# Patient Record
Sex: Male | Born: 1955 | Race: White | Hispanic: No | Marital: Married | Smoking: Never smoker
Health system: Southern US, Community
[De-identification: ages and names within clinical notes are randomized; demographics above are authoritative.]

## PROBLEM LIST (undated history)

## (undated) DIAGNOSIS — Z789 Other specified health status: Secondary | ICD-10-CM

## (undated) DIAGNOSIS — T7840XA Allergy, unspecified, initial encounter: Secondary | ICD-10-CM

## (undated) DIAGNOSIS — K429 Umbilical hernia without obstruction or gangrene: Secondary | ICD-10-CM

## (undated) DIAGNOSIS — E785 Hyperlipidemia, unspecified: Secondary | ICD-10-CM

## (undated) HISTORY — PX: EYE SURGERY: SHX253

## (undated) HISTORY — PX: HERNIA REPAIR: SHX51

## (undated) HISTORY — DX: Hyperlipidemia, unspecified: E78.5

## (undated) HISTORY — DX: Allergy, unspecified, initial encounter: T78.40XA

## (undated) HISTORY — PX: NO PAST SURGERIES: SHX2092

## (undated) HISTORY — PX: COLONOSCOPY: SHX174

---

## 2007-12-02 ENCOUNTER — Encounter: Admission: RE | Admit: 2007-12-02 | Discharge: 2007-12-02 | Payer: Self-pay | Admitting: Emergency Medicine

## 2007-12-24 ENCOUNTER — Ambulatory Visit: Payer: Self-pay | Admitting: Internal Medicine

## 2008-01-27 ENCOUNTER — Ambulatory Visit: Payer: Self-pay | Admitting: Internal Medicine

## 2008-01-27 ENCOUNTER — Encounter: Payer: Self-pay | Admitting: Internal Medicine

## 2011-10-16 ENCOUNTER — Ambulatory Visit (INDEPENDENT_AMBULATORY_CARE_PROVIDER_SITE_OTHER): Payer: Federal, State, Local not specified - PPO | Admitting: Emergency Medicine

## 2011-10-16 DIAGNOSIS — E789 Disorder of lipoprotein metabolism, unspecified: Secondary | ICD-10-CM

## 2011-10-16 DIAGNOSIS — R079 Chest pain, unspecified: Secondary | ICD-10-CM

## 2012-01-04 ENCOUNTER — Other Ambulatory Visit: Payer: Self-pay | Admitting: Family Medicine

## 2012-01-04 MED ORDER — ROSUVASTATIN CALCIUM 10 MG PO TABS: 10.0000 mg | ORAL_TABLET | Freq: Every day | ORAL | Status: DC

## 2012-01-30 ENCOUNTER — Other Ambulatory Visit: Payer: Self-pay | Admitting: Dermatology

## 2012-04-28 ENCOUNTER — Other Ambulatory Visit: Payer: Self-pay | Admitting: Physician Assistant

## 2012-07-27 ENCOUNTER — Other Ambulatory Visit: Payer: Self-pay | Admitting: Physician Assistant

## 2012-08-05 ENCOUNTER — Ambulatory Visit (INDEPENDENT_AMBULATORY_CARE_PROVIDER_SITE_OTHER): Payer: Federal, State, Local not specified - PPO | Admitting: Emergency Medicine

## 2012-08-05 ENCOUNTER — Encounter: Payer: Self-pay | Admitting: Emergency Medicine

## 2012-08-05 VITALS — BP 134/80 | HR 53 | Temp 97.8°F | Resp 16 | Ht 71.5 in | Wt 216.6 lb

## 2012-08-05 DIAGNOSIS — Z23 Encounter for immunization: Secondary | ICD-10-CM

## 2012-08-05 DIAGNOSIS — E785 Hyperlipidemia, unspecified: Secondary | ICD-10-CM

## 2012-08-05 DIAGNOSIS — Z79899 Other long term (current) drug therapy: Secondary | ICD-10-CM

## 2012-08-05 MED ORDER — ROSUVASTATIN CALCIUM 10 MG PO TABS: 10.0000 mg | ORAL_TABLET | Freq: Every day | ORAL | Status: DC

## 2012-08-05 NOTE — Progress Notes (Signed)
  Subjective:    Patient ID: Ralph Hayes, male    DOB: [birth year], 56 y.o.   MRN: [id number]  HPI patient currently on Crestor 10 mg a day for cholesterol treatment. He denies any myalgias. He continues to try and exercise and has been a little better watching his diet the    Review of Systems     Objective:   Physical Exam HEENT exam is negative chest clear heart regular rate no murmurs        Assessment & Plan:  Lipid panel was done today to check his fasting lipid panel and cemented recheck next visit for a physical.

## 2012-08-06 LAB — COMPREHENSIVE METABOLIC PANEL
ALT: 26 U/L (ref 0–53)
AST: 19 U/L (ref 0–37)
Albumin: 4.4 g/dL (ref 3.5–5.2)
BUN: 13 mg/dL (ref 6–23)
CO2: 29 mEq/L (ref 19–32)
Calcium: 9.1 mg/dL (ref 8.4–10.5)
Chloride: 107 mEq/L (ref 96–112)
Creat: 0.88 mg/dL (ref 0.50–1.35)
Potassium: 4.4 mEq/L (ref 3.5–5.3)

## 2012-08-06 LAB — LIPID PANEL
Cholesterol: 178 mg/dL (ref 0–200)
Triglycerides: 137 mg/dL (ref <150)

## 2013-02-04 ENCOUNTER — Telehealth: Payer: Self-pay | Admitting: Radiology

## 2013-02-04 NOTE — Telephone Encounter (Signed)
Pt would like to talk to Dr. Cleta Alberts (she is aware he is off til Friday) in regards to her husband, Ahan (his pt). Would not give me any further info Nancy [phone], I called Harriett Sine and she wants to speak to Dr Cleta Alberts, about patient. She states the room he sleeps in smells bad, she wants to know if there is something to do about this. He does not complain of feeling bad. He does have a hernia that needs repair, but other than this he is well. Speak to me about this please.

## 2013-02-05 NOTE — Telephone Encounter (Signed)
I called the patient's wife Ralph Hayes and asked for more information as to where she thought the odor was coming from. I asked her to call back and give [place] more information. I told her I would be happy to see Ralph Hayes and reevaluate him to see if we can figure out the source of the odor.

## 2013-02-05 NOTE — Telephone Encounter (Signed)
Wife called back and explains that she smells the bad odor in the mornings in the room and in sheets. States it is not a matter of hygiene, pt takes good care of himself w/bathing, hair, etc., and teeth are in good shape. Pt does not complain of anything except occasionally of a pain in the upper left abdomen/ribs area. Wife reports that she doesn't just notice the odor on his skin or when she gets close to him, it seems to be more of an odor in the room from his breathing all night, maybe. She stated that he would be very embarrassed to know she has called with this concern, and will discuss with him if needed. I advised her that pt is due for f/up visit anyway and she stated that if he got a call that he needs to schedule an appt he will be happy to come in. She didn't know if we could just examine him for this during normal f/up visit. Wife wondered if odor could be from the Crestor he takes or a supplement. Dr Cleta Alberts, please advise.

## 2013-02-06 NOTE — Telephone Encounter (Signed)
Raynelle Fanning, will you call patient and schedule an appt for him, his wife does not want him to know she called [place] about him, just let him know Dr Cleta Alberts needs to see him in follow up. See me if questions, send message back to me when done, so I can call his wife. Amy

## 2013-02-06 NOTE — Telephone Encounter (Signed)
It certainly could be a vitamin or supplement especially if he is taking any fish oil.Marland Kitchen He certainly can get an odor to his breath. I will be happy to address this at his followup visit

## 2013-02-10 NOTE — Telephone Encounter (Signed)
Left msg for pt to call to schedule 6 month f-up with Dr. Cleta Alberts.

## 2013-02-13 ENCOUNTER — Ambulatory Visit (INDEPENDENT_AMBULATORY_CARE_PROVIDER_SITE_OTHER): Payer: Federal, State, Local not specified - PPO | Admitting: Emergency Medicine

## 2013-02-13 ENCOUNTER — Telehealth: Payer: Self-pay | Admitting: Emergency Medicine

## 2013-02-13 VITALS — BP 120/77 | HR 64 | Temp 97.9°F | Resp 18 | Wt 218.0 lb

## 2013-02-13 DIAGNOSIS — E785 Hyperlipidemia, unspecified: Secondary | ICD-10-CM

## 2013-02-13 DIAGNOSIS — Z79899 Other long term (current) drug therapy: Secondary | ICD-10-CM

## 2013-02-13 DIAGNOSIS — N529 Male erectile dysfunction, unspecified: Secondary | ICD-10-CM

## 2013-02-13 LAB — LIPID PANEL
HDL: 47 mg/dL (ref >39)
LDL Cholesterol: 116 mg/dL — ABNORMAL HIGH (ref 0–99)
Triglycerides: 111 mg/dL (ref <150)

## 2013-02-13 LAB — AST: AST: 19 U/L (ref 0–37)

## 2013-02-13 MED ORDER — ROSUVASTATIN CALCIUM 10 MG PO TABS: 10.0000 mg | ORAL_TABLET | Freq: Every day | ORAL | Status: DC

## 2013-02-13 MED ORDER — TADALAFIL 20 MG PO TABS: ORAL_TABLET | ORAL | Status: DC

## 2013-02-13 NOTE — Progress Notes (Signed)
  Subjective:    Patient ID: Ralph Hayes, male    DOB: 09-22-1956, 57 y.o.   MRN: [id number]  HPI patient in for followup of his hyperlipidemia. He is currently on Crestor. There is some question about whether there is an odor to his skin his wife was concerned about that. He also is interested in trying to medication for ED. He has decreased libido and he and his wife have talked about this since her hysterectomy.    Review of Systems     Objective:   Physical Exam patient is alert and cooperative and not in any distress. His neck is supple. His chest is clear to both auscultation and percussion. His heart is regular rate without murmurs rubs or gallops abdomen reveals a small umbilical hernia but no other masses or tenderness        Assessment & Plan:  We'll go ahead and check routine labs. I did send her a prescription for Cialis for him to try. I refilled his Crestor.

## 2013-02-13 NOTE — Telephone Encounter (Signed)
Please call patient with him know his cholesterol is good. his LDL is still slightly high. liver tests are normal no change in medication

## 2013-02-16 NOTE — Telephone Encounter (Signed)
No response to my calls either thanks.

## 2013-02-16 NOTE — Telephone Encounter (Signed)
Patient advised.

## 2013-02-16 NOTE — Telephone Encounter (Signed)
No response from pt in regards to scheduling an appt.

## 2013-04-26 ENCOUNTER — Telehealth: Payer: Self-pay | Admitting: *Deleted

## 2013-04-26 NOTE — Telephone Encounter (Signed)
Pt wife came into office today and was goin to check in to talk about her husband because her husband was not able to come in to be seen because he was not able to get out of bed and come down stairs because of back pain.  I advised her that we cannot see her and talk to her about her husband. She stated that she called the office twice.  I only saw the one message which was put under his wife name Ralph Hayes and the other message was not put in.  I advised her that we would have to see him before we would be able to do anything for him and that if she cannot get him out of bed that she should call an ambulance to get him transported to the ER to be evaluated.  She stated that she does not understand why we could not do this because he has been a long time pt.  I also advised that we have to document things and we have to see the pt and cannot treat over the phone without seeing this recently for this issue.

## 2013-04-27 ENCOUNTER — Ambulatory Visit: Payer: Federal, State, Local not specified - PPO

## 2013-04-27 ENCOUNTER — Ambulatory Visit (INDEPENDENT_AMBULATORY_CARE_PROVIDER_SITE_OTHER): Payer: Federal, State, Local not specified - PPO | Admitting: Emergency Medicine

## 2013-04-27 VITALS — BP 124/70 | HR 68 | Temp 98.0°F | Resp 16 | Ht 71.5 in | Wt 221.0 lb

## 2013-04-27 DIAGNOSIS — M62830 Muscle spasm of back: Secondary | ICD-10-CM

## 2013-04-27 DIAGNOSIS — M5380 Other specified dorsopathies, site unspecified: Secondary | ICD-10-CM

## 2013-04-27 DIAGNOSIS — Z77090 Contact with and (suspected) exposure to asbestos: Secondary | ICD-10-CM

## 2013-04-27 DIAGNOSIS — M545 Low back pain, unspecified: Secondary | ICD-10-CM

## 2013-04-27 MED ORDER — METHOCARBAMOL 750 MG PO TABS: 750.0000 mg | ORAL_TABLET | Freq: Four times a day (QID) | ORAL | Status: DC

## 2013-04-27 MED ORDER — HYDROCODONE-ACETAMINOPHEN 5-325 MG PO TABS: 1.0000 | ORAL_TABLET | Freq: Four times a day (QID) | ORAL | Status: DC | PRN

## 2013-04-27 MED ORDER — NABUMETONE 750 MG PO TABS
750.0000 mg | ORAL_TABLET | Freq: Two times a day (BID) | ORAL | Status: DC
Start: 2013-04-27 — End: 2015-04-07
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
## 2013-09-03 ENCOUNTER — Other Ambulatory Visit: Payer: Self-pay

## 2014-01-06 ENCOUNTER — Ambulatory Visit: Payer: Federal, State, Local not specified - PPO

## 2014-01-06 ENCOUNTER — Ambulatory Visit (INDEPENDENT_AMBULATORY_CARE_PROVIDER_SITE_OTHER): Payer: Federal, State, Local not specified - PPO | Admitting: Emergency Medicine

## 2014-01-06 VITALS — BP 110/80 | HR 48 | Temp 98.0°F | Resp 16 | Ht 72.0 in | Wt 225.0 lb

## 2014-01-06 DIAGNOSIS — R079 Chest pain, unspecified: Secondary | ICD-10-CM

## 2014-01-06 DIAGNOSIS — R0781 Pleurodynia: Secondary | ICD-10-CM

## 2014-01-06 DIAGNOSIS — M940 Chondrocostal junction syndrome [Tietze]: Secondary | ICD-10-CM

## 2014-01-06 LAB — POCT UA - MICROSCOPIC ONLY
BACTERIA, U MICROSCOPIC: NEGATIVE
Casts, Ur, LPF, POC: NEGATIVE
Crystals, Ur, HPF, POC: NEGATIVE
EPITHELIAL CELLS, URINE PER MICROSCOPY: NEGATIVE
MUCUS UA: NEGATIVE
RBC, URINE, MICROSCOPIC: NEGATIVE
Yeast, UA: NEGATIVE

## 2014-01-06 LAB — POCT URINALYSIS DIPSTICK
BILIRUBIN UA: NEGATIVE
Glucose, UA: NEGATIVE
KETONES UA: NEGATIVE
Leukocytes, UA: NEGATIVE
Nitrite, UA: NEGATIVE
PH UA: 5.5
Protein, UA: NEGATIVE
RBC UA: NEGATIVE
Urobilinogen, UA: 1

## 2014-01-06 LAB — POCT CBC
GRANULOCYTE PERCENT: 60.6 % (ref 37–80)
HEMATOCRIT: 46.2 % (ref 43.5–53.7)
HEMOGLOBIN: 14.7 g/dL (ref 14.1–18.1)
LYMPH, POC: 2 (ref 0.6–3.4)
MCH, POC: 29 pg (ref 27–31.2)
MCHC: 31.8 g/dL (ref 31.8–35.4)
MCV: 91.2 fL (ref 80–97)
MID (cbc): 0.3 (ref 0–0.9)
MPV: 8.1 fL (ref 0–99.8)
POC GRANULOCYTE: 3.6 (ref 2–6.9)
POC LYMPH PERCENT: 33.9 %L (ref 10–50)
POC MID %: 5.5 %M (ref 0–12)
Platelet Count, POC: 265 10*3/uL (ref 142–424)
RBC: 5.07 M/uL (ref 4.69–6.13)
RDW, POC: 14.2 %
WBC: 6 10*3/uL (ref 4.6–10.2)

## 2014-01-06 MED ORDER — MELOXICAM 15 MG PO TABS: 15.0000 mg | ORAL_TABLET | Freq: Every day | ORAL | Status: DC

## 2014-01-06 NOTE — Progress Notes (Signed)
Subjective:    Patient ID: Ralph Hayes, male    DOB: 11/11/1955, 58 y.o.   MRN: [id number] This chart was scribed for [name], MD by Terressa Koyanagi, ED Scribe. This patient was seen in room 1 and the patient's care was started at 8:24 AM.  HPI HPI Comments: Ralph Hayes is a 58 y.o. male who presents to the Emergency Department complaining of intermittent left sided abd pain onset a few days ago. Pt denies any associated pain around his ribs. Pt states that the pain is aggravated when he bends forward or twists his abdomen. Pt denies any injury to the area. Pt further denies SOB, nausea, vomiting, change in bowel movements. Pt further reports that he is UTD on his colonoscopy.   Review of Systems  Constitutional: Negative for fever.  Respiratory: Negative for shortness of breath.   Gastrointestinal: Positive for abdominal pain. Negative for nausea and vomiting.  Neurological: Negative.   Psychiatric/Behavioral: Negative.    Objective:   Physical Exam CONSTITUTIONAL: Well developed/well nourished HEAD: Normocephalic/atraumatic EYES: EOMI/PERRL ENMT: Mucous membranes moist NECK: supple no meningeal signs SPINE:entire spine nontender CV: S1/S2 noted, no murmurs/rubs/gallops noted LUNGS: Lungs are clear to auscultation bilaterally, no apparent distress ABDOMEN: soft, nontender, no rebound or guarding GU:no cva tenderness MUSCULOSKELETAL: Tenderness over the lower margin of the left lower ribs NEURO: Pt is awake/alert, moves all extremitiesx4 EXTREMITIES: pulses normal, full ROM SKIN: warm, color normal PSYCH: no abnormalities of mood noted [facility] reading (PRIMARY) by  Dr Everlene Farrier no acute disease . Please comment on heart size. Results for orders placed in visit on 01/06/14  POCT CBC      Result Value Ref Range   WBC 6.0  4.6 - 10.2 K/uL   Lymph, poc 2.0  0.6 - 3.4   POC LYMPH PERCENT 33.9  10 - 50 %L   MID (cbc) 0.3  0 - 0.9   POC MID % 5.5  0 - 12 %M   POC Granulocyte 3.6   2 - 6.9   Granulocyte percent 60.6  37 - 80 %G   RBC 5.07  4.69 - 6.13 M/uL   Hemoglobin 14.7  14.1 - 18.1 g/dL   HCT, POC 46.2  43.5 - 53.7 %   MCV 91.2  80 - 97 fL   MCH, POC 29.0  27 - 31.2 pg   MCHC 31.8  31.8 - 35.4 g/dL   RDW, POC 14.2     Platelet Count, POC 265  142 - 424 K/uL   MPV 8.1  0 - 99.8 fL  POCT UA - MICROSCOPIC ONLY      Result Value Ref Range   WBC, Ur, HPF, POC 0-1     RBC, urine, microscopic neg     Bacteria, U Microscopic neg     Mucus, UA neg     Epithelial cells, urine per micros neg     Crystals, Ur, HPF, POC neg     Casts, Ur, LPF, POC neg     Yeast, UA neg    POCT URINALYSIS DIPSTICK      Result Value Ref Range   Color, UA amber     Clarity, UA clear     Glucose, UA neg     Bilirubin, UA neg     Ketones, UA neg     Spec Grav, UA >=1.030     Blood, UA neg     pH, UA 5.5     Protein, UA neg  Urobilinogen, UA 1.0     Nitrite, UA neg     Leukocytes, UA Negative         Assessment & Plan:  I suspect this is musculoskeletal pain. We'll treat with Mobic 15 mg one a day for 10 days. If no improvement after that time we'll proceed with a CT of that area

## 2014-01-06 NOTE — Patient Instructions (Signed)

## 2014-01-15 ENCOUNTER — Telehealth: Payer: Self-pay

## 2014-01-15 NOTE — Telephone Encounter (Signed)
Left message on machine for patient to call back with symptoms, etc. Will schedule CT as soon as hear from patient.  Also, need to ask Dr Everlene Farrier what kind of CT study we should order.  Awaiting call back from patient.

## 2014-01-15 NOTE — Telephone Encounter (Signed)
Patient called requesting to speak with Dr. Everlene Farrier regarding his previous visit.  Patient stated that Dr. Everlene Farrier asked him to call in if he was not feeling better and to arrange a CT scan. Please have Dr. Everlene Farrier or staff member call this patient ASAP.  Thank You!!!

## 2014-01-18 ENCOUNTER — Telehealth: Payer: Self-pay

## 2014-01-18 ENCOUNTER — Other Ambulatory Visit: Payer: Self-pay | Admitting: Emergency Medicine

## 2014-01-18 DIAGNOSIS — R1032 Left lower quadrant pain: Secondary | ICD-10-CM

## 2014-01-18 NOTE — Telephone Encounter (Signed)
Duplicate message- Waiting for response from provider to order CT scan.

## 2014-01-18 NOTE — Telephone Encounter (Signed)
Patient is calling asking about a ct scan please call him at [phone]

## 2014-01-18 NOTE — Telephone Encounter (Signed)
I left a message on his answering machine. I placed an order for CT abdomen and pelvis to evaluate persistent left upper abdominal pain

## 2014-01-18 NOTE — Telephone Encounter (Signed)
Called- LM per last OV if pain continues to proceed with CT scan.  Dr. Everlene Farrier please advise what CT you want ordered. CT with and without?

## 2014-01-25 ENCOUNTER — Ambulatory Visit
Admission: RE | Admit: 2014-01-25 | Discharge: 2014-01-25 | Disposition: A | Discharge status: Home / Self Care | Payer: Federal, State, Local not specified - PPO | Source: Ambulatory Visit | Attending: Emergency Medicine | Admitting: Emergency Medicine

## 2014-01-25 DIAGNOSIS — R1032 Left lower quadrant pain: Secondary | ICD-10-CM

## 2014-01-25 MED ORDER — IOHEXOL 300 MG/ML  SOLN
125.0000 mL | Freq: Once | INTRAMUSCULAR | Status: AC | PRN
  Administered 2014-01-25: 125 mL via INTRAVENOUS

## 2014-01-25 MED ORDER — IOHEXOL 300 MG/ML  SOLN: 100.0000 mL | Freq: Once | INTRAMUSCULAR | Status: DC | PRN

## 2014-04-14 ENCOUNTER — Ambulatory Visit (INDEPENDENT_AMBULATORY_CARE_PROVIDER_SITE_OTHER): Payer: Federal, State, Local not specified - PPO

## 2014-04-14 ENCOUNTER — Ambulatory Visit (INDEPENDENT_AMBULATORY_CARE_PROVIDER_SITE_OTHER): Payer: Federal, State, Local not specified - PPO | Admitting: Family Medicine

## 2014-04-14 VITALS — BP 130/84 | HR 84 | Temp 98.1°F | Resp 16 | Ht 71.0 in | Wt 223.2 lb

## 2014-04-14 DIAGNOSIS — M25569 Pain in unspecified knee: Secondary | ICD-10-CM

## 2014-04-14 DIAGNOSIS — M25561 Pain in right knee: Secondary | ICD-10-CM

## 2014-04-14 NOTE — Patient Instructions (Signed)
Over the counter ibuprofen if needed, ice to affected area if needed and avoid direct pressure as much as possible for next few weeks. If pain persists, let me know and I can refer you to ortho for another opinion.
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
## 2014-05-04 ENCOUNTER — Encounter: Payer: Self-pay | Admitting: Emergency Medicine

## 2014-05-04 ENCOUNTER — Ambulatory Visit (INDEPENDENT_AMBULATORY_CARE_PROVIDER_SITE_OTHER): Payer: Federal, State, Local not specified - PPO | Admitting: Emergency Medicine

## 2014-05-04 VITALS — BP 110/70 | HR 49 | Temp 97.7°F | Resp 16 | Ht 72.0 in | Wt 223.8 lb

## 2014-05-04 DIAGNOSIS — Z0000 Encounter for general adult medical examination without abnormal findings: Secondary | ICD-10-CM

## 2014-05-04 DIAGNOSIS — I498 Other specified cardiac arrhythmias: Secondary | ICD-10-CM

## 2014-05-04 DIAGNOSIS — R001 Bradycardia, unspecified: Secondary | ICD-10-CM | POA: Insufficient documentation

## 2014-05-04 LAB — COMPLETE METABOLIC PANEL WITH GFR
ALBUMIN: 4.3 g/dL (ref 3.5–5.2)
ALK PHOS: 77 U/L (ref 39–117)
ALT: 23 U/L (ref 0–53)
AST: 15 U/L (ref 0–37)
BUN: 20 mg/dL (ref 6–23)
CO2: 27 meq/L (ref 19–32)
Calcium: 8.7 mg/dL (ref 8.4–10.5)
Chloride: 107 mEq/L (ref 96–112)
Creat: 0.97 mg/dL (ref 0.50–1.35)
GFR, EST NON AFRICAN AMERICAN: 86 mL/min
GLUCOSE: 99 mg/dL (ref 70–99)
POTASSIUM: 4 meq/L (ref 3.5–5.3)
Sodium: 142 mEq/L (ref 135–145)
Total Bilirubin: 1.3 mg/dL — ABNORMAL HIGH (ref 0.2–1.2)
Total Protein: 6.5 g/dL (ref 6.0–8.3)

## 2014-05-04 LAB — CBC WITH DIFFERENTIAL/PLATELET
BASOS PCT: 1 % (ref 0–1)
Basophils Absolute: 0.1 10*3/uL (ref 0.0–0.1)
EOS ABS: 0.2 10*3/uL (ref 0.0–0.7)
Eosinophils Relative: 4 % (ref 0–5)
HEMATOCRIT: 46.4 % (ref 39.0–52.0)
HEMOGLOBIN: 15.7 g/dL (ref 13.0–17.0)
LYMPHS ABS: 1.6 10*3/uL (ref 0.7–4.0)
Lymphocytes Relative: 30 % (ref 12–46)
MCH: 28.4 pg (ref 26.0–34.0)
MCHC: 33.8 g/dL (ref 30.0–36.0)
MCV: 84.1 fL (ref 78.0–100.0)
MONO ABS: 0.5 10*3/uL (ref 0.1–1.0)
MONOS PCT: 9 % (ref 3–12)
Neutro Abs: 3 10*3/uL (ref 1.7–7.7)
Neutrophils Relative %: 56 % (ref 43–77)
Platelets: 231 10*3/uL (ref 150–400)
RBC: 5.52 MIL/uL (ref 4.22–5.81)
RDW: 13.4 % (ref 11.5–15.5)
WBC: 5.4 10*3/uL (ref 4.0–10.5)

## 2014-05-04 LAB — POCT URINALYSIS DIPSTICK
BILIRUBIN UA: NEGATIVE
Blood, UA: NEGATIVE
GLUCOSE UA: NEGATIVE
KETONES UA: NEGATIVE
LEUKOCYTES UA: NEGATIVE
NITRITE UA: NEGATIVE
PH UA: 6
Protein, UA: NEGATIVE
Spec Grav, UA: 1.02
Urobilinogen, UA: 2

## 2014-05-04 LAB — LIPID PANEL
Cholesterol: 276 mg/dL — ABNORMAL HIGH (ref 0–200)
HDL: 54 mg/dL (ref >39)
LDL CALC: 183 mg/dL — AB (ref 0–99)
Total CHOL/HDL Ratio: 5.1 Ratio
Triglycerides: 196 mg/dL — ABNORMAL HIGH (ref <150)
VLDL: 39 mg/dL (ref 0–40)

## 2014-05-04 LAB — IFOBT (OCCULT BLOOD): IMMUNOLOGICAL FECAL OCCULT BLOOD TEST: NEGATIVE

## 2014-05-04 MED ORDER — MUPIROCIN 2 % EX OINT: TOPICAL_OINTMENT | CUTANEOUS | Status: DC

## 2014-05-04 MED ORDER — ZOSTER VACCINE LIVE 19400 UNT/0.65ML ~~LOC~~ SOLR: 0.6500 mL | Freq: Once | SUBCUTANEOUS | Status: DC

## 2014-05-04 NOTE — Progress Notes (Signed)
   Subjective:    Patient ID: Ralph Hayes, male    DOB: Feb 14, 1956, 58 y.o.   MRN: [id number]  HPI    Review of Systems  Constitutional: Negative.   HENT: Negative.   Eyes: Negative.   Respiratory: Negative.   Cardiovascular: Negative.   Gastrointestinal: Negative.   Endocrine: Negative.   Genitourinary: Negative.   Musculoskeletal: Negative.   Skin: Negative.   Allergic/Immunologic: Negative.   Neurological: Negative.   Hematological: Negative.   Psychiatric/Behavioral: Negative.        Objective:   Physical Exam  Constitutional: He is oriented to person, place, and time. He appears well-developed and well-nourished.  HENT:  Head: Normocephalic.  Right Ear: External ear normal.  Left Ear: External ear normal.  Eyes: Pupils are equal, round, and reactive to light.  Neck: Normal range of motion. No tracheal deviation present. No thyromegaly present.  Cardiovascular: Normal heart sounds and intact distal pulses.   The patient has a slow regular rate  Pulmonary/Chest: Effort normal and breath sounds normal. No respiratory distress.  Abdominal: Soft. Bowel sounds are normal.  Genitourinary: Prostate normal.  Musculoskeletal:  There is mild tenderness along the lateral joint space of the right knee  Neurological: He is alert and oriented to person, place, and time. He has normal reflexes.  Skin: Skin is warm and dry.  Psychiatric: He has a normal mood and affect. His behavior is normal. Judgment and thought content normal.   Ekg normal sinus rhythm with sinus bradycardia T-wave down the Lead 3 Results for orders placed in visit on 05/04/14  POCT URINALYSIS DIPSTICK      Result Value Ref Range   Color, UA yellow     Clarity, UA clear     Glucose, UA neg     Bilirubin, UA neg     Ketones, UA neg     Spec Grav, UA 1.020     Blood, UA neg     pH, UA 6.0     Protein, UA neg     Urobilinogen, UA 2.0     Nitrite, UA neg     Leukocytes, UA Negative    IFOBT (OCCULT  BLOOD)      Result Value Ref Range   IFOBT Negative          Assessment & Plan:  Routine labs done. Prescription given for Zostavax. He is up-to-date on tetanus. Last colonoscopy was 2009. No change in medication. He developed an odor when he took crestor.

## 2014-05-04 NOTE — Addendum Note (Signed)
Addended byCandice Camp on: 05/04/2014 12:06 PM   Modules accepted: Orders

## 2014-05-05 LAB — PSA: PSA: 2.78 ng/mL (ref <=4.00)

## 2014-05-06 ENCOUNTER — Telehealth: Payer: Self-pay

## 2014-05-06 DIAGNOSIS — E785 Hyperlipidemia, unspecified: Secondary | ICD-10-CM

## 2014-05-06 MED ORDER — ATORVASTATIN CALCIUM 40 MG PO TABS: 40.0000 mg | ORAL_TABLET | Freq: Every day | ORAL | Status: DC

## 2014-05-06 NOTE — Telephone Encounter (Signed)
Spoke to pt, he is rescheduling his PSA appt, for 4 months along with the chol. Review.  He is aware of med sent to pharm.

## 2014-05-06 NOTE — Telephone Encounter (Signed)
PT STATES SOMEONE HAD CALLED HIM REGARDING HIS LABS RESULTS, HOWEVER PATIENT IS UNCLEAR ABOUT THE PLAN OF TREATMENT FOR CHOLESTEROL ISSUES. PLEASE CALL PT TO ADVISE IF RX IS BEING PRESCRIBED (HE DID SCHEDULE A 6 MONTH F/U IN January TO HAVE PSA LEVELS RECHECKED)

## 2014-08-16 DIAGNOSIS — H17813 Minor opacity of cornea, bilateral: Secondary | ICD-10-CM | POA: Insufficient documentation

## 2014-08-16 DIAGNOSIS — Z9889 Other specified postprocedural states: Secondary | ICD-10-CM | POA: Insufficient documentation

## 2014-09-14 ENCOUNTER — Telehealth: Payer: Self-pay

## 2014-09-14 NOTE — Telephone Encounter (Signed)
Pt is scheduled to see dr Everlene Farrier on September 21, 2014 for a cholesterol recheck and he has another appointment for November 09, 2014 for PSA recheck. Pt wants to know if this is correct to have two separate appointments and wanted to ask if the two issues can be addressed in one appointment instead of two.

## 2014-09-14 NOTE — Telephone Encounter (Signed)
FYI  Pt will follow up on November 24 for both issues and keep the January appt if follow up is needed. He will cancel that appt at Nov appt if no follow up is needed.

## 2014-09-21 ENCOUNTER — Encounter: Payer: Self-pay | Admitting: Emergency Medicine

## 2014-09-21 ENCOUNTER — Ambulatory Visit (INDEPENDENT_AMBULATORY_CARE_PROVIDER_SITE_OTHER): Payer: Federal, State, Local not specified - PPO | Admitting: Emergency Medicine

## 2014-09-21 VITALS — BP 116/72 | HR 51 | Temp 98.4°F | Resp 16 | Ht 72.0 in | Wt 218.0 lb

## 2014-09-21 DIAGNOSIS — R972 Elevated prostate specific antigen [PSA]: Secondary | ICD-10-CM

## 2014-09-21 DIAGNOSIS — E785 Hyperlipidemia, unspecified: Secondary | ICD-10-CM

## 2014-09-21 DIAGNOSIS — Z23 Encounter for immunization: Secondary | ICD-10-CM

## 2014-09-21 DIAGNOSIS — Z79899 Other long term (current) drug therapy: Secondary | ICD-10-CM

## 2014-09-21 LAB — LIPID PANEL
Cholesterol: 149 mg/dL (ref 0–200)
HDL: 47 mg/dL (ref >39)
LDL CALC: 79 mg/dL (ref 0–99)
TRIGLYCERIDES: 114 mg/dL (ref <150)
Total CHOL/HDL Ratio: 3.2 Ratio
VLDL: 23 mg/dL (ref 0–40)

## 2014-09-21 LAB — AST: AST: 24 U/L (ref 0–37)

## 2014-09-21 NOTE — Progress Notes (Signed)
Subjective:   This chart was scribed for [name], MD by Erling Conte, Medical Scribe. This patient was seen in Room 22  and the patient's care was started at 8:13 AM.   Patient ID: Ralph Hayes, male    DOB: 07-Oct-1956, 58 y.o.   MRN: [id number]  Chief Complaint  Patient presents with  . Hyperlipidemia    HPI HPI Comments: Ralph Hayes is a 58 y.o. male who presents to the [facility] who is here for a recheck on his cholesterol. Pt cholesterol was 276 back in July when he was last here. Pt states he has been doing better with Lipitor. He was previously on Crestor but reported some increased body odor with that medication. Pt would also like to have a flu shot and PSA done at this visit. Pt last PSA was 2.78. He denies any muscle aches.   Patient Active Problem List   Diagnosis Date Noted  . Bradycardia 05/04/2014   Past Medical History  Diagnosis Date  . Hyperlipidemia    No past surgical history on file. Allergies  Allergen Reactions  . Penicillins     Swell   Prior to Admission medications   Medication Sig Start Date End Date Taking? Authorizing Provider  aspirin 81 MG tablet Take 81 mg by mouth daily.    Historical Provider, MD  atorvastatin (LIPITOR) 40 MG tablet Take 1 tablet (40 mg total) by mouth daily. 05/06/14   [name], MD  B Complex-C (B-COMPLEX WITH VITAMIN C) tablet Take 1 tablet by mouth daily.    Historical Provider, MD  cholecalciferol (VITAMIN D) 1000 UNITS tablet Take 1,000 Units by mouth daily.    Historical Provider, MD  fish oil-omega-3 fatty acids 1000 MG capsule Take 2 g by mouth daily.    Historical Provider, MD  Glucosamine-Chondroit-Vit C-Mn (GLUCOSAMINE 1500 COMPLEX PO) Take by mouth.    Historical Provider, MD  HYDROcodone-acetaminophen (NORCO) 5-325 MG per tablet Take 1 tablet by mouth every 6 (six) hours as needed for pain. 04/27/13   [name], MD  meloxicam (MOBIC) 15 MG tablet Take 1 tablet (15 mg total)  by mouth daily. 01/06/14   [name], MD  methocarbamol (ROBAXIN) 750 MG tablet Take 1 tablet (750 mg total) by mouth 4 (four) times daily. 04/27/13   [name], MD  Multiple Vitamin (MULTIVITAMIN) tablet Take 1 tablet by mouth daily.    Historical Provider, MD  mupirocin ointment (BACTROBAN) 2 % Apply to wound bid 05/04/14   [name], MD  nabumetone (RELAFEN) 750 MG tablet Take 1 tablet (750 mg total) by mouth 2 (two) times daily. 04/27/13   [name], MD  rosuvastatin (CRESTOR) 10 MG tablet Take 1 tablet (10 mg total) by mouth daily. 02/13/13   [name], MD  tadalafil (CIALIS) 20 MG tablet Take one half tablet one to 2 hours prior to intercourse 02/13/13   [name], MD  zoster vaccine live, PF, (ZOSTAVAX) 63335 UNT/0.65ML injection Inject 19,400 Units into the skin once. 05/04/14   [name], MD   History   Social History  . Marital Status: Married    Spouse Name: N/A    Number of Children: N/A  . Years of Education: N/A   Occupational History  . Not on file.   Social History Main Topics  . Smoking status: Never Smoker   . Smokeless tobacco: Not on file  . Alcohol Use:  Yes     Comment: twice a wk  . Drug Use: No  . Sexual Activity: Yes   Other Topics Concern  . Not on file   Social History Narrative   Married. Education: [employer].     Review of Systems  Constitutional: Negative for fever, chills, fatigue and unexpected weight change.  Eyes: Negative for visual disturbance.  Respiratory: Negative for cough, chest tightness and shortness of breath.   Cardiovascular: Negative for chest pain, palpitations and leg swelling.  Gastrointestinal: Negative for abdominal pain and blood in stool.  Musculoskeletal: Negative for myalgias and arthralgias.  Neurological: Negative for dizziness, light-headedness and headaches.        Objective:   Physical Exam CONSTITUTIONAL: Well developed/well nourished HEAD: Normocephalic/atraumatic EYES: EOMI/PERRL ENMT:  Mucous membranes moist NECK: supple no meningeal signs SPINE/BACK:entire spine nontender CV: S1/S2 noted, no murmurs/rubs/gallops noted LUNGS: Lungs are clear to auscultation bilaterally, no apparent distress ABDOMEN: soft, nontender, no rebound or guarding, bowel sounds noted throughout abdomen GU:no cva tenderness NEURO: Pt is awake/alert/appropriate, moves all extremitiesx4.  No facial droop.   EXTREMITIES: pulses normal/equal, full ROM SKIN: warm, color normal PSYCH: no abnormalities of mood noted, alert and oriented to situation       Assessment & Plan:  Follow up lipid panel along with PSA done today. Flu shot will be given. Routine follow-up for his physical exam   I personally performed the services described in this documentation, which was scribed in my presence. The recorded information has been reviewed and is accurate.

## 2014-09-22 LAB — PSA: PSA: 2.63 ng/mL (ref <=4.00)

## 2014-09-30 DIAGNOSIS — H35362 Drusen (degenerative) of macula, left eye: Secondary | ICD-10-CM | POA: Insufficient documentation

## 2014-10-01 ENCOUNTER — Telehealth: Payer: Self-pay

## 2014-10-01 NOTE — Telephone Encounter (Signed)
Pt Ralph Hayes on VM. Wants a copy of his labs mailed to him. Sent. Pt notified. Dr. Everlene Farrier, pt received flu shot and now his deltoid is numb. Says he still has full strength and ROM but wants to know why it might be numb.

## 2014-10-01 NOTE — Telephone Encounter (Signed)
Call patient and tell him know  sometimes there is inflammation of a superficial  cutaneous nerve of the skin and the area of injection can stay numb for 2-3 weeks. If the area of numbness persists I would need to take a look at it

## 2014-10-04 NOTE — Telephone Encounter (Signed)
Pt called in and says he is still concerned and I read him the note to RTC. Thank you

## 2014-10-04 NOTE — Telephone Encounter (Signed)
LM for pt- if he is still having concerns in this area please RTC since this was 2 days ago.

## 2014-10-06 ENCOUNTER — Ambulatory Visit (INDEPENDENT_AMBULATORY_CARE_PROVIDER_SITE_OTHER): Payer: Federal, State, Local not specified - PPO | Admitting: Emergency Medicine

## 2014-10-06 VITALS — BP 122/70 | HR 50 | Temp 98.4°F | Resp 16 | Ht 73.0 in | Wt 223.0 lb

## 2014-10-06 DIAGNOSIS — M79601 Pain in right arm: Secondary | ICD-10-CM

## 2014-10-06 DIAGNOSIS — T50Z95A Adverse effect of other vaccines and biological substances, initial encounter: Secondary | ICD-10-CM

## 2014-10-06 MED ORDER — NAPROXEN SODIUM 550 MG PO TABS: 550.0000 mg | ORAL_TABLET | Freq: Two times a day (BID) | ORAL | Status: DC

## 2014-10-06 MED ORDER — ACETAMINOPHEN-CODEINE #3 300-30 MG PO TABS: 1.0000 | ORAL_TABLET | ORAL | Status: DC | PRN

## 2014-10-06 NOTE — Progress Notes (Signed)
[facility] [address], [place] [address] [phone]  Date:  10/06/2014   Name:  NISHANTH MCCAUGHAN   DOB:  14-Sep-1956   MRN:  [id number]  PCP:  Jenny Reichmann, MD    Chief Complaint: Numbness   History of Present Illness:  Ralph Hayes is a 58 y.o. very pleasant male patient who presents with the following:  Had flu shot just before thanksgiving and has pain in the site of the injection starting 2 days after injection No local reaction or systemic allergic symptoms.  Simply has localized pain at site of injection Pain is worse with use but is able to ADL's and work without problem No improvement with over the counter medications or other home remedies.  Denies other complaint or health concern today.   Patient Active Problem List   Diagnosis Date Noted  . Bradycardia 05/04/2014    Past Medical History  Diagnosis Date  . Hyperlipidemia     History reviewed. No pertinent past surgical history.  History  Substance Use Topics  . Smoking status: Never Smoker   . Smokeless tobacco: Not on file  . Alcohol Use: Yes     Comment: twice a wk    History reviewed. No pertinent family history.  Allergies  Allergen Reactions  . Penicillins     Swell    Medication list has been reviewed and updated.  Current Outpatient Prescriptions on File Prior to Visit  Medication Sig Dispense Refill  . aspirin 81 MG tablet Take 81 mg by mouth daily.    Marland Kitchen atorvastatin (LIPITOR) 40 MG tablet Take 1 tablet (40 mg total) by mouth daily. 90 tablet 3  . B Complex-C (B-COMPLEX WITH VITAMIN C) tablet Take 1 tablet by mouth daily.    . cholecalciferol (VITAMIN D) 1000 UNITS tablet Take 1,000 Units by mouth daily.    . fish oil-omega-3 fatty acids 1000 MG capsule Take 2 g by mouth daily.    . Glucosamine-Chondroit-Vit C-Mn (GLUCOSAMINE 1500 COMPLEX PO) Take by mouth.    Marland Kitchen HYDROcodone-acetaminophen (NORCO) 5-325 MG per tablet Take 1 tablet by mouth every 6 (six) hours  as needed for pain. 20 tablet 0  . meloxicam (MOBIC) 15 MG tablet Take 1 tablet (15 mg total) by mouth daily. 20 tablet 0  . methocarbamol (ROBAXIN) 750 MG tablet Take 1 tablet (750 mg total) by mouth 4 (four) times daily. 40 tablet 3  . Multiple Vitamin (MULTIVITAMIN) tablet Take 1 tablet by mouth daily.    . mupirocin ointment (BACTROBAN) 2 % Apply to wound bid 30 g 0  . nabumetone (RELAFEN) 750 MG tablet Take 1 tablet (750 mg total) by mouth 2 (two) times daily. 60 tablet 1  . rosuvastatin (CRESTOR) 10 MG tablet Take 1 tablet (10 mg total) by mouth daily. 90 tablet 3  . tadalafil (CIALIS) 20 MG tablet Take one half tablet one to 2 hours prior to intercourse 5 tablet 11  . zoster vaccine live, PF, (ZOSTAVAX) 93810 UNT/0.65ML injection Inject 19,400 Units into the skin once. 1 each 0   No current facility-administered medications on file prior to visit.    Review of Systems:  As per HPI, otherwise negative.    Physical Examination: Filed Vitals:   10/06/14 1538  BP: 122/70  Pulse: 50  Temp: 98.4 F (36.9 C)  Resp: 16   Filed Vitals:   10/06/14 1538  Height: 6\' 1"  (1.854 m)  Weight: 223 lb (101.152 kg)  Body mass index is 29.43 kg/(m^2). Ideal Body Weight: Weight in (lb) to have BMI = 25: 189.1   GEN: WDWN, NAD, Non-toxic, Alert & Oriented x 3 HEENT: Atraumatic, Normocephalic.  Ears and Nose: No external deformity. EXTR: No clubbing/cyanosis/edema NEURO: Normal gait.  PSYCH: Normally interactive. Conversant. Not depressed or anxious appearing.  Calm demeanor.  RIGHT arm:  No evidence of allergic reaction.  Point tenderness inferior deltoid.  Full AROM and against resistance  Assessment and Plan: Post injection pain Anaprox tyl #3 Heat  Follow up if no improvement   Signed,  Ellison Carwin, MD

## 2014-11-09 ENCOUNTER — Ambulatory Visit: Payer: Federal, State, Local not specified - PPO | Admitting: Emergency Medicine

## 2014-11-09 ENCOUNTER — Telehealth: Payer: Self-pay

## 2014-11-09 NOTE — Telephone Encounter (Signed)
LMVM for patient regarding missed appointment this morning.  Per Dr. Everlene Farrier advised patient to come in to 104 this afternoon at any time for his appointment to have his PSA drawn or to call back and we would place a future order for his PSA.

## 2014-11-11 ENCOUNTER — Telehealth: Payer: Self-pay

## 2014-11-11 DIAGNOSIS — IMO0002 Reserved for concepts with insufficient information to code with codable children: Secondary | ICD-10-CM

## 2014-11-11 NOTE — Telephone Encounter (Signed)
Patient is expericing pain in his upper right arm, (Seen Dr. Ouida Sills for this).  Requesting a referral Hulan Saas)   [phone]  Shanon Brow

## 2014-11-11 NOTE — Telephone Encounter (Signed)
Spoke to pt, he is aware of referral

## 2014-11-12 ENCOUNTER — Telehealth: Payer: Self-pay | Admitting: Family Medicine

## 2014-11-12 ENCOUNTER — Other Ambulatory Visit: Payer: Self-pay | Admitting: Emergency Medicine

## 2014-11-12 NOTE — Telephone Encounter (Signed)
Spoke to pt, moved his appt up to 1.20.16.

## 2014-11-12 NOTE — Telephone Encounter (Signed)
Can you keep an eye out for an appointment sooner for this patient.  His appointment is set up for Friday the 22nd.  The best number to reach him is his cell number.

## 2014-11-12 NOTE — Telephone Encounter (Signed)
PATIENT IS REQUESTING A REFILL ON THE MEDICATION DR. ANDERSON GAVE HIM FOR HIS TINGLING. IT IS ACETAMINOPHEN - COD #3. HE ONLY HAS 2 PILLS LEFT. HE SAID THE PHARMACY SHOULD HAVE CONTACTED [place] AS WELL. BEST PHONE [phone] (CELL)  PHARMACY CHOICE IS [facility] ON [address].  [place]

## 2014-11-13 ENCOUNTER — Telehealth: Payer: Self-pay

## 2014-11-13 NOTE — Telephone Encounter (Signed)
The patient called to request refill of Tylenol #3 that he was prescribed on 10/06/14.  The patient is experiencing a lot of pain and is having trouble sleeping at night.  The patient related that he is taking his wife on a trip this weekend and does not want to be in pain during the trip.  The patient stated that the pharmacy asked him to call our office for further details regarding his prescription.  Please call the patient at [phone] to discuss.

## 2014-11-14 NOTE — Telephone Encounter (Signed)
Please apologize to the patient.  I was just able to get to the message.  He will need an OV if he needs more pain medication.

## 2014-11-16 MED ORDER — ACETAMINOPHEN-CODEINE #3 300-30 MG PO TABS
1.0000 | ORAL_TABLET | ORAL | Status: DC | PRN
Start: 2014-11-16 — End: 2015-04-07

## 2014-11-16 NOTE — Telephone Encounter (Signed)
I called and spoke with the patient. I agree to refill his Tylenol 3. He is due to see the sports medicine specialist tomorrow. He is complaining of numbness and tingling involving the thumb following his flu shot injection. I requested he have the specialists send me a copy of his note so I could review it. I advised that if he wanted to see the neurologist I would be happy to get him an appointment.

## 2014-11-16 NOTE — Telephone Encounter (Signed)
Pt advised- requested script call into the pharmacy.  Script called into pharmacy.

## 2014-11-16 NOTE — Telephone Encounter (Signed)
Spoke to pt- he is very upset. He states his pain in his arm is as a result of a flu shot given by this office. He has had nerve pain and numbness in his arm and hand since that flu shot. He states he has also felt that this office has not dealt with the issue appropriately. Pt is in very bad pain that keeps him up at night.   Dr. Everlene Farrier- Can this script be called in given the circumstances? Pt will be taking this to Dr. Tamala Julian and Dr. Everlene Farrier.

## 2014-11-17 ENCOUNTER — Encounter: Payer: Self-pay | Admitting: Family Medicine

## 2014-11-17 ENCOUNTER — Other Ambulatory Visit (INDEPENDENT_AMBULATORY_CARE_PROVIDER_SITE_OTHER): Payer: Federal, State, Local not specified - PPO

## 2014-11-17 ENCOUNTER — Ambulatory Visit (INDEPENDENT_AMBULATORY_CARE_PROVIDER_SITE_OTHER)
Admission: RE | Admit: 2014-11-17 | Discharge: 2014-11-17 | Disposition: A | Discharge status: Home / Self Care | Payer: Federal, State, Local not specified - PPO | Source: Ambulatory Visit | Attending: Family Medicine | Admitting: Family Medicine

## 2014-11-17 ENCOUNTER — Ambulatory Visit (INDEPENDENT_AMBULATORY_CARE_PROVIDER_SITE_OTHER): Payer: Federal, State, Local not specified - PPO | Admitting: Family Medicine

## 2014-11-17 VITALS — BP 130/72 | HR 56 | Wt 222.0 lb

## 2014-11-17 DIAGNOSIS — M25511 Pain in right shoulder: Secondary | ICD-10-CM

## 2014-11-17 DIAGNOSIS — M129 Arthropathy, unspecified: Secondary | ICD-10-CM

## 2014-11-17 DIAGNOSIS — M5010 Cervical disc disorder with radiculopathy, unspecified cervical region: Secondary | ICD-10-CM

## 2014-11-17 DIAGNOSIS — M19019 Primary osteoarthritis, unspecified shoulder: Secondary | ICD-10-CM

## 2014-11-17 MED ORDER — PREDNISONE 50 MG PO TABS: 50.0000 mg | ORAL_TABLET | Freq: Every day | ORAL | Status: DC

## 2014-11-17 NOTE — Patient Instructions (Signed)
Good to see you Xray downstairs  Keep monitor at eye level.  Tennisball between shoulder blades with sitting.  Ice 20 minutes 2 times daily. Usually after activity and before bed. Exercises 3 times a week.  Vitamin D 2000 IU daily Turmeric 500mg  twice daily Pennsaid twice daily.  Prednisone 50mg  daily for 5 days On wall with heels, butt shoulder and head touching 5 minutes a day See me again in 2-3 weeks.

## 2014-11-17 NOTE — Progress Notes (Signed)
[facility] [address], [place] [address] Phone: [phone] Subjective:    I'm seeing this patient by the request  of:  DAUB, STEVE A, MD   CC: Right arm pain  YIA:[id number] Ralph Hayes is a 59 y.o. male coming in with complaint of right arm pain. Patient was given an injection right before Thanksgiving. Patient said having pain 2 days after the injection. Patient states that this pain has been localized quite some time. Patient has seen other providers. Vision states that some of the pain on the lateral aspect of his arm has improved slowly. Patient states Diffley seem to be more associated with the injection. Patient states that now he is having more pain that seems to be radiating down his arm. States that he usually seems to be the thumb as well as the index finger. Patient denies any weakness or any loss of strength. Patient states that it can be annoying. Patient states now sometimes it does stop him from activity. Patient has noticed ecchymosis neck as certain way he can make pain in his shoulder worse. Patient rates the severity of pain a 6 out of 10. Has tried some over-the-counter medicines as well as meloxicam with minimal benefit. Patient is also been given Tylenol 3 which has been helpful when he needs it.      Past medical history, social, surgical and family history all reviewed in electronic medical record.   Review of Systems: No headache, visual changes, nausea, vomiting, diarrhea, constipation, dizziness, abdominal pain, skin rash, fevers, chills, night sweats, weight loss, swollen lymph nodes, body aches, joint swelling, muscle aches, chest pain, shortness of breath, mood changes.   Objective Blood pressure 130/72, pulse 56, weight 222 lb (100.699 kg), SpO2 94 %.  General: No apparent distress alert and oriented x3 mood and affect normal, dressed appropriately.  HEENT: Pupils equal, extraocular movements intact  Respiratory:  Patient's speak in full sentences and does not appear short of breath  Cardiovascular: No lower extremity edema, non tender, no erythema  Skin: Warm dry intact with no signs of infection or rash on extremities or on axial skeleton.  Abdomen: Soft nontender  Neuro: Cranial nerves II through XII are intact, neurovascularly intact in all extremities with 2+ DTRs and 2+ pulses.  Lymph: No lymphadenopathy of posterior or anterior cervical chain or axillae bilaterally.  Gait normal with good balance and coordination.  MSK:  Non tender with full range of motion and good stability and symmetric strength and tone of shoulders, elbows, wrist, hip, knee and ankles bilaterally.  Neck: Inspection unremarkable. No palpable stepoffs. Positive Spurling's maneuver. Full neck range of motion Grip strength and sensation normal in bilateral hands Strength good C4 to T1 distribution No sensory change to C4 to T1 Negative Hoffman sign bilaterally Reflexes normal Shoulder: Right Inspection reveals no abnormalities, atrophy or asymmetry. Palpation is normal with no tenderness over AC joint or bicipital groove. ROM is full in all planes. Rotator cuff strength normal throughout. No signs of impingement with negative Neer and Hawkin's tests, empty can sign. Speeds and Yergason's tests normal. No labral pathology noted with negative Obrien's, negative clunk and good stability. Normal scapular function observed. No painful arc and no drop arm sign. No apprehension sign  MSK US performed of: Right This study was ordered, performed, and interpreted by Ralph Hayes D.O.  Shoulder:   Supraspinatus:  Appears normal on long and transverse views, no bursal bulge seen with shoulder abduction on  impingement view. Very minimal bursal enlargement noted Infraspinatus:  Appears normal on long and transverse views. Subscapularis:  Appears normal on long and transverse views. Teres Minor:  Appears normal on long and  transverse views. AC joint:  Severe capsule distention as well as moderate osteophytic changes. Glenohumeral Joint:  Appears normal without effusion. Glenoid Labrum:  Intact without visualized tears. Biceps Tendon:  Appears normal on long and transverse views, no fraying of tendon, tendon located in intertubercular groove, no subluxation with shoulder internal or external rotation. No increased power doppler signal.  Impression: Normal shoulder with acromioclavicular joint arthritis   Procedure note 88828; 15 minutes spent for Therapeutic exercises as stated in above notes.  This included exercises focusing on stretching, strengthening, with significant focus on eccentric aspects.   Proper technique shown and discussed handout in great detail with ATC.  All questions were discussed and answered.     Impression and Recommendations:     This case required medical decision making of moderate complexity.

## 2014-11-17 NOTE — Assessment & Plan Note (Signed)
I believe that patient's pain as well as the radiation down his arm is likely secondary to more of a cervical radiculopathy with patient having a positive Spurling's. X-rays for the neck or to be ordered today. Patient does have muscle relaxers and anti-inflammatories but we will try prednisone for a five-day burs. We discussed over-the-counter medications a can be beneficial as well. We discussed home exercises and patient did work with a Clinical research associate for greater amount of time. We discussed an icing regimen. Patient will make these changes and come back and see me again in 3 weeks for further evaluation. Patient continues to have difficulty or worsening symptoms he will come back earlier. Advance imaging may be necessary at that time. If x-rays show mild arthritis patient may be a candidate for manipulation.

## 2014-11-19 ENCOUNTER — Ambulatory Visit: Payer: Federal, State, Local not specified - PPO | Admitting: Family Medicine

## 2015-04-07 ENCOUNTER — Ambulatory Visit (INDEPENDENT_AMBULATORY_CARE_PROVIDER_SITE_OTHER): Payer: Federal, State, Local not specified - PPO | Admitting: Emergency Medicine

## 2015-04-07 ENCOUNTER — Emergency Department (HOSPITAL_COMMUNITY): Payer: Federal, State, Local not specified - PPO

## 2015-04-07 ENCOUNTER — Emergency Department (HOSPITAL_COMMUNITY)
Admission: EM | Admit: 2015-04-07 | Discharge: 2015-04-07 | Disposition: A | Discharge status: Home / Self Care | Payer: Federal, State, Local not specified - PPO | Attending: Emergency Medicine | Admitting: Emergency Medicine

## 2015-04-07 ENCOUNTER — Encounter (HOSPITAL_COMMUNITY): Payer: Self-pay | Admitting: Neurology

## 2015-04-07 ENCOUNTER — Encounter: Payer: Self-pay | Admitting: Emergency Medicine

## 2015-04-07 VITALS — BP 124/80 | HR 50 | Temp 98.1°F | Resp 16 | Ht 71.5 in | Wt 222.8 lb

## 2015-04-07 DIAGNOSIS — Z7982 Long term (current) use of aspirin: Secondary | ICD-10-CM | POA: Diagnosis not present

## 2015-04-07 DIAGNOSIS — R0789 Other chest pain: Secondary | ICD-10-CM | POA: Diagnosis not present

## 2015-04-07 DIAGNOSIS — Z880 Allergy status to penicillin: Secondary | ICD-10-CM | POA: Insufficient documentation

## 2015-04-07 DIAGNOSIS — E785 Hyperlipidemia, unspecified: Secondary | ICD-10-CM

## 2015-04-07 DIAGNOSIS — Z79899 Other long term (current) drug therapy: Secondary | ICD-10-CM | POA: Insufficient documentation

## 2015-04-07 DIAGNOSIS — R972 Elevated prostate specific antigen [PSA]: Secondary | ICD-10-CM | POA: Diagnosis not present

## 2015-04-07 DIAGNOSIS — R079 Chest pain, unspecified: Secondary | ICD-10-CM | POA: Diagnosis not present

## 2015-04-07 LAB — CBC
HCT: 43.7 % (ref 39.0–52.0)
Hemoglobin: 14.8 g/dL (ref 13.0–17.0)
MCH: 29.2 pg (ref 26.0–34.0)
MCHC: 33.9 g/dL (ref 30.0–36.0)
MCV: 86.2 fL (ref 78.0–100.0)
Platelets: 194 10*3/uL (ref 150–400)
RBC: 5.07 MIL/uL (ref 4.22–5.81)
RDW: 13.5 % (ref 11.5–15.5)
WBC: 7.2 10*3/uL (ref 4.0–10.5)

## 2015-04-07 LAB — BASIC METABOLIC PANEL
ANION GAP: 10 (ref 5–15)
BUN: 19 mg/dL (ref 6–20)
CO2: 24 mmol/L (ref 22–32)
Calcium: 9 mg/dL (ref 8.9–10.3)
Chloride: 105 mmol/L (ref 101–111)
Creatinine, Ser: 0.93 mg/dL (ref 0.61–1.24)
GFR calc non Af Amer: >60 mL/min (ref >60)
GLUCOSE: 84 mg/dL (ref 65–99)
POTASSIUM: 3.7 mmol/L (ref 3.5–5.1)
Sodium: 139 mmol/L (ref 135–145)

## 2015-04-07 LAB — I-STAT TROPONIN, ED: Troponin i, poc: 0 ng/mL (ref 0.00–0.08)

## 2015-04-07 LAB — I-STAT CHEM 8, ED
BUN: 22 mg/dL — ABNORMAL HIGH (ref 6–20)
CHLORIDE: 104 mmol/L (ref 101–111)
Calcium, Ion: 1.15 mmol/L (ref 1.12–1.23)
Creatinine, Ser: 1 mg/dL (ref 0.61–1.24)
Glucose, Bld: 86 mg/dL (ref 65–99)
HEMATOCRIT: 46 % (ref 39.0–52.0)
Hemoglobin: 15.6 g/dL (ref 13.0–17.0)
POTASSIUM: 3.7 mmol/L (ref 3.5–5.1)
SODIUM: 141 mmol/L (ref 135–145)
TCO2: 24 mmol/L (ref 0–100)

## 2015-04-07 NOTE — ED Provider Notes (Signed)
CSN: [id number]     Arrival date & time 04/07/15  1619 History   First MD Initiated Contact with Patient 04/07/15 1632     Chief Complaint  Patient presents with  . Chest Pain      HPI  Expand All Collapse All   Per ems- pt is coming from [place] where he was there for check up today; told doctor he had been having cp for 1 month lasting about 5 mins. Today cp occurred and lasted 20 mins. Pain is substernal and nonradiating. Denies cp at current. patient has an appointment tomorrow at 1:00 for a stress test.          Past Medical History  Diagnosis Date  . Hyperlipidemia    History reviewed. No pertinent past surgical history. No family history on file. History  Substance Use Topics  . Smoking status: Never Smoker   . Smokeless tobacco: Not on file  . Alcohol Use: Yes     Comment: twice a wk    Review of Systems  All other systems reviewed and are negative  Allergies  Penicillins  Home Medications   Prior to Admission medications   Medication Sig Start Date End Date Taking? Authorizing Provider  aspirin 81 MG tablet Take 81 mg by mouth daily.    Historical Provider, MD  atorvastatin (LIPITOR) 40 MG tablet Take 1 tablet (40 mg total) by mouth daily. 05/06/14   [name], MD   BP 137/76 mmHg  Pulse 48  Temp(Src) 99.1 F (37.3 C) (Oral)  Resp 11  SpO2 99% Physical Exam Physical Exam  Nursing note and vitals reviewed. Constitutional: He is oriented to person, place, and time. He appears well-developed and well-nourished. No distress.  HENT:  Head: Normocephalic and atraumatic.  Eyes: Pupils are equal, round, and reactive to light.  Neck: Normal range of motion.  Cardiovascular: Normal rate and intact distal pulses.   Pulmonary/Chest: No respiratory distress.  Abdominal: Normal appearance. He exhibits no distension.  Musculoskeletal: Normal range of motion.  Neurological: He is alert and oriented to person, place, and time. No cranial nerve deficit.   Skin: Skin is warm and dry. No rash noted.  Psychiatric: He has a normal mood and affect. His behavior is normal.   ED Course  Procedures (including critical care time) Heart score = 3 Labs Review Labs Reviewed  I-STAT CHEM 8, ED - Abnormal; Notable for the following:    BUN 22 (*)    All other components within normal limits  CBC  BASIC METABOLIC PANEL  I-STAT TROPOININ, ED    Imaging Review Dg Chest 2 View  04/07/2015   CLINICAL DATA:  Chest pain for 1 day  EXAM: CHEST - 2 VIEW  COMPARISON:  01/06/2014  FINDINGS: The heart size and mediastinal contours are within normal limits. Both lungs are clear. The visualized skeletal structures are unremarkable.  IMPRESSION: No active disease.   Electronically Signed   By: Inez Catalina M.D.   On: 04/07/2015 16:56     EKG Interpretation   Date/Time:  Thursday April 07 2015 16:21:04 EDT Ventricular Rate:  55 PR Interval:  136 QRS Duration: 105 QT Interval:  451 QTC Calculation: 431 R Axis:   -35 Text Interpretation:  Sinus rhythm Left axis deviation Abnormal R-wave  progression, early transition No previous tracing Confirmed by Jendaya Gossett  MD,  Logan Vegh ([id number]) on 04/07/2015 4:31:26 PM     After treatment in the ED the patient feels back to baseline and  wants to go home. MDM   Final diagnoses:  Chest discomfort        Leonard Schwartz, MD 04/07/15 1725

## 2015-04-07 NOTE — ED Notes (Signed)
Per ems- pt is coming from [place] where he was there for check up today; told doctor he had been having cp for 1 month lasting about 5 mins. Today cp occurred and lasted 20 mins. Pain is substernal and nonradiating. Denies cp at current. BP 135/90, HR 53 EKG SB. Pt is a x 4

## 2015-04-07 NOTE — ED Notes (Signed)
Patient transported to X-ray 

## 2015-04-07 NOTE — Discharge Instructions (Signed)

## 2015-04-07 NOTE — ED Notes (Signed)
MD at bedside. 

## 2015-04-07 NOTE — Patient Instructions (Signed)
Please be at Dr. Irven Shelling office at [address] [address] at 1:30 pm on 04/08/2015 for your 2pm appointment with Dr. Einar Gip.   Please bring your  1.  Photo ID 2.  Insurance Card 3.  Copay 4.  All of your medication bottles. 5.  Copy of your EKG

## 2015-04-07 NOTE — Progress Notes (Signed)
Subjective:  This chart was scribed for Ralph Russian, MD by Ladene Artist, ED Scribe. The patient was seen in room 22. Patient's care was started at 2:56 PM.   Patient ID: Ralph Hayes, male    DOB: 1956/03/03, 58 y.o.   MRN: [id number]  Chief Complaint  Patient presents with  . 6 month check up  . Chest Pain    soreness under the ribs  . Numbness    right thumb and right heal   HPI HPI Comments: Ralph Hayes is a 59 y.o. male, with a h/o HLD, who presents to the [facility] complaining of sudden onset of intermittent, moderate chest pain for the past few days; last episode 2 hours ago. Pt states that he was working on his computer earlier today when he noticed central chest pain and associated chest tightness that lasted for 15-20 minutes. He describes pain as a sharp, non-radiating sensation that is unchanged with exertion. He further reports approximately 12 episodes of similar chest pain in the past few weeks, including one episode yesterday. He has been treating with 2 baby aspirins daily. Pt denies nausea, dizziness, SOB at this time. He denies family h/o cardiac related illnesses.   Rib Pain Pt presents with persistent bilateral rib pain for the past few weeks. He describes pain as constant soreness. Pt denies injury and heavy lifting. He does a lot of ballroom dancing but does not suspect that this has caused pain.   Pt has an upcoming 40th high school reunion and plans to go on a dancing cruise in November.   Past Medical History  Diagnosis Date  . Hyperlipidemia    Current Outpatient Prescriptions on File Prior to Visit  Medication Sig Dispense Refill  . aspirin 81 MG tablet Take 81 mg by mouth daily.    Marland Kitchen atorvastatin (LIPITOR) 40 MG tablet Take 1 tablet (40 mg total) by mouth daily. 90 tablet 3   No current facility-administered medications on file prior to visit.   Allergies  Allergen Reactions  . Penicillins     Swell   Review of Systems   Respiratory: Positive for chest tightness and shortness of breath.   Cardiovascular: Positive for chest pain.  Gastrointestinal: Negative for nausea.  Musculoskeletal:       + Rib pain  Neurological: Negative for dizziness.   BP 124/80 mmHg  Pulse 50  Temp(Src) 98.1 F (36.7 C) (Oral)  Resp 16  Ht 5' 11.5" (1.816 m)  Wt 222 lb 12.8 oz (101.061 kg)  BMI 30.64 kg/m2  SpO2 98% Repeat BP: 130/78 on L     Objective:   Physical Exam  Constitutional: He is oriented to person, place, and time. He appears well-developed and well-nourished. No distress.  HENT:  Head: Normocephalic and atraumatic.  Eyes: Conjunctivae and EOM are normal.  Neck: Neck supple. No tracheal deviation present.  Cardiovascular: Normal rate, regular rhythm and normal heart sounds.   Pulmonary/Chest: Effort normal and breath sounds normal. No respiratory distress.  Musculoskeletal: Normal range of motion.  Neurological: He is alert and oriented to person, place, and time.  Skin: Skin is warm and dry.  Psychiatric: He has a normal mood and affect. His behavior is normal.  Nursing note and vitals reviewed. EKG shows T-wave inversion in lead 3 left axis deviation and voltage in aVL consistent with LVH.    Assessment and plan not clear what is going on in this patient. He does have an abnormal  EKG and episodes of chest discomfort but last being at 1:00 today lasting 20 minutes. I did refer him to the ER for troponins. I did discuss the case with Dr. Einar Gip and he was kind enough to see the patient tomorrow at 2:00 if his troponins are negative today. He is already on aspirin and statin. He does run a low heart rate of 51 all of this visits here I personally performed the services described in this documentation, which was scribed in my presence. The recorded information has been reviewed and is accurate.  Ralph Jordan, MD

## 2015-04-08 LAB — LIPID PANEL
Cholesterol: 154 mg/dL (ref 0–200)
HDL: 53 mg/dL (ref >=40)
LDL Cholesterol: 80 mg/dL (ref 0–99)
TRIGLYCERIDES: 106 mg/dL (ref <150)
Total CHOL/HDL Ratio: 2.9 Ratio
VLDL: 21 mg/dL (ref 0–40)

## 2015-04-08 LAB — PSA: PSA: 2.56 ng/mL (ref <=4.00)

## 2015-04-11 ENCOUNTER — Telehealth: Payer: Self-pay

## 2015-04-11 NOTE — Telephone Encounter (Signed)
I called and spoke with patient. He has been to the ER and also seen Dr. Einar Gip. He is scheduled to have stress test in the near future. I advised him that if he has pain lasting over 20-30 minutes he needs to the rechecked in the ER. I told him we needed to proceed with his workup as quickly as possible

## 2015-04-11 NOTE — Telephone Encounter (Signed)
Spoke with wife, they went to Dr. Einar Gip for his appt on Friday. Dr. Einar Gip was not concerned at the moment about his EKG. She does not know what to do with him. His wife is concerned about this and feels like he is confused about what is going on with him. If he has this pain again she wants to make sure he knows what to do. He feels embarrassed about calling an ambulance etc but his wife wants someone to tell him (Dr. Everlene Farrier) that it is ok to be safe than sorry. She feels it would only take a phone call from you since he does not listen to her about this. Can you help?

## 2015-04-11 NOTE — Telephone Encounter (Signed)
Ralph Hayes would like a call back regarding her husband that saw Dr.Daub the other day about his visit. Please call [phone]

## 2015-04-24 ENCOUNTER — Other Ambulatory Visit: Payer: Self-pay | Admitting: Emergency Medicine

## 2015-05-12 ENCOUNTER — Encounter: Payer: Self-pay | Admitting: Internal Medicine

## 2015-06-02 ENCOUNTER — Other Ambulatory Visit: Payer: Self-pay | Admitting: Physician Assistant

## 2015-06-13 ENCOUNTER — Telehealth: Payer: Self-pay

## 2015-06-13 MED ORDER — ATORVASTATIN CALCIUM 40 MG PO TABS: 40.0000 mg | ORAL_TABLET | Freq: Every day | ORAL | Status: DC

## 2015-06-13 NOTE — Telephone Encounter (Signed)
Resent RF in 90 day supply and notified pt on VM.

## 2015-06-13 NOTE — Telephone Encounter (Signed)
Pt is needing a refill on his staton 90 day supply

## 2015-08-02 ENCOUNTER — Encounter: Payer: Self-pay | Admitting: Emergency Medicine

## 2015-09-09 ENCOUNTER — Other Ambulatory Visit: Payer: Self-pay | Admitting: Emergency Medicine

## 2015-11-13 ENCOUNTER — Telehealth: Payer: Self-pay | Admitting: Family Medicine

## 2015-11-13 NOTE — Telephone Encounter (Signed)
lmom to call and rescheduled her appt with daub 

## 2016-03-08 ENCOUNTER — Ambulatory Visit (INDEPENDENT_AMBULATORY_CARE_PROVIDER_SITE_OTHER): Payer: Federal, State, Local not specified - PPO | Admitting: Emergency Medicine

## 2016-03-08 ENCOUNTER — Encounter: Payer: Self-pay | Admitting: Emergency Medicine

## 2016-03-08 VITALS — BP 126/78 | HR 63 | Temp 98.2°F | Resp 16 | Ht 72.0 in | Wt 224.2 lb

## 2016-03-08 DIAGNOSIS — Z0000 Encounter for general adult medical examination without abnormal findings: Secondary | ICD-10-CM | POA: Diagnosis not present

## 2016-03-08 DIAGNOSIS — Z1159 Encounter for screening for other viral diseases: Secondary | ICD-10-CM | POA: Diagnosis not present

## 2016-03-08 DIAGNOSIS — K429 Umbilical hernia without obstruction or gangrene: Secondary | ICD-10-CM

## 2016-03-08 DIAGNOSIS — R972 Elevated prostate specific antigen [PSA]: Secondary | ICD-10-CM

## 2016-03-08 DIAGNOSIS — E785 Hyperlipidemia, unspecified: Secondary | ICD-10-CM | POA: Diagnosis not present

## 2016-03-08 LAB — COMPLETE METABOLIC PANEL WITH GFR
ALT: 32 U/L (ref 9–46)
AST: 19 U/L (ref 10–35)
Albumin: 4.1 g/dL (ref 3.6–5.1)
Alkaline Phosphatase: 72 U/L (ref 40–115)
BILIRUBIN TOTAL: 1.2 mg/dL (ref 0.2–1.2)
BUN: 16 mg/dL (ref 7–25)
CALCIUM: 8.8 mg/dL (ref 8.6–10.3)
CHLORIDE: 106 mmol/L (ref 98–110)
CO2: 27 mmol/L (ref 20–31)
CREATININE: 0.89 mg/dL (ref 0.70–1.33)
GFR, Est Non African American: >89 mL/min (ref >=60)
Glucose, Bld: 89 mg/dL (ref 65–99)
Potassium: 4.3 mmol/L (ref 3.5–5.3)
Sodium: 145 mmol/L (ref 135–146)
Total Protein: 6 g/dL — ABNORMAL LOW (ref 6.1–8.1)

## 2016-03-08 LAB — CBC WITH DIFFERENTIAL/PLATELET
BASOS ABS: 52 {cells}/uL (ref 0–200)
Basophils Relative: 1 %
EOS ABS: 208 {cells}/uL (ref 15–500)
EOS PCT: 4 %
HCT: 45 % (ref 38.5–50.0)
Hemoglobin: 14.8 g/dL (ref 13.2–17.1)
LYMPHS PCT: 27 %
Lymphs Abs: 1404 cells/uL (ref 850–3900)
MCH: 28.8 pg (ref 27.0–33.0)
MCHC: 32.9 g/dL (ref 32.0–36.0)
MCV: 87.5 fL (ref 80.0–100.0)
MONOS PCT: 7 %
MPV: 9.3 fL (ref 7.5–12.5)
Monocytes Absolute: 364 cells/uL (ref 200–950)
NEUTROS PCT: 61 %
Neutro Abs: 3172 cells/uL (ref 1500–7800)
PLATELETS: 187 10*3/uL (ref 140–400)
RBC: 5.14 MIL/uL (ref 4.20–5.80)
RDW: 14.2 % (ref 11.0–15.0)
WBC: 5.2 10*3/uL (ref 3.8–10.8)

## 2016-03-08 LAB — POCT URINALYSIS DIP (MANUAL ENTRY)
Bilirubin, UA: NEGATIVE
GLUCOSE UA: NEGATIVE
Ketones, POC UA: NEGATIVE
Leukocytes, UA: NEGATIVE
NITRITE UA: NEGATIVE
PH UA: 7.5
Protein Ur, POC: NEGATIVE
RBC UA: NEGATIVE
Spec Grav, UA: 1.02
UROBILINOGEN UA: 2

## 2016-03-08 LAB — LIPID PANEL
CHOL/HDL RATIO: 3.1 ratio (ref <=5.0)
Cholesterol: 153 mg/dL (ref 125–200)
HDL: 50 mg/dL (ref >=40)
LDL Cholesterol: 81 mg/dL (ref <130)
Triglycerides: 111 mg/dL (ref <150)
VLDL: 22 mg/dL (ref <30)

## 2016-03-08 NOTE — Progress Notes (Addendum)
By signing my name below, I, Mesha Guinyard, attest that this documentation has been prepared under the direction and in the presence of [name], MD.  Electronically Signed: Verlee Monte, Medical Scribe. 03/08/2016. 2:04 PM.  Chief Complaint:  Chief Complaint  Patient presents with  . Annual Exam    HPI: Ralph Hayes is a 60 y.o. male who reports to [facility] today for his annual exam. Pt mentions he is doing fine, going out to festivals, and doing art activities frequently. Pt reports exercising, and dancing regularly with his active lifestyle. Pt complains of protruding umbilical hernia and wants to get a referral for it. Pt had his last colonoscopy in 2009. Pt reports seeing cardiologist a year ago.  Past Medical History  Diagnosis Date  . Hyperlipidemia    No past surgical history on file. Social History   Social History  . Marital Status: Married    Spouse Name: N/A  . Number of Children: N/A  . Years of Education: N/A   Social History Main Topics  . Smoking status: Never Smoker   . Smokeless tobacco: None  . Alcohol Use: Yes     Comment: twice a wk  . Drug Use: No  . Sexual Activity: Yes   Other Topics Concern  . None   Social History Narrative   Married. Education: [employer].    No family history on file. Allergies  Allergen Reactions  . Penicillins     Swell   Prior to Admission medications   Medication Sig Start Date End Date Taking? Authorizing Provider  aspirin 81 MG tablet Take 81 mg by mouth daily.   Yes Historical Provider, MD  atorvastatin (LIPITOR) 40 MG tablet TAKE 1 TABLET BY MOUTH EVERY DAY.   "OFFICE VISIT NEEDED FOR REFILLS" 09/09/15  Yes [name], MD     ROS: The patient denies fevers, chills, night sweats, unintentional weight loss, chest pain, palpitations, wheezing, dyspnea on exertion, nausea, vomiting, abdominal pain, dysuria, hematuria, melena, numbness, weakness, or tingling.  All other systems have been reviewed and were  otherwise negative with the exception of those mentioned in the HPI and as above.    PHYSICAL EXAM: Filed Vitals:   03/08/16 1356  BP: 126/78  Pulse: 63  Temp: 98.2 F (36.8 C)  Resp: 16   Body mass index is 30.4 kg/(m^2).  General: Alert, no acute distress HEENT:  Normocephalic, atraumatic, oropharynx patent. Eye: Juliette Mangle [facility] Cardiovascular:  Regular rate and rhythm, no rubs murmurs or gallops.  No Carotid bruits, radial pulse intact. No pedal edema.  Respiratory: Clear to auscultation bilaterally.  No wheezes, rales, or rhonchi.  No cyanosis, no use of accessory musculature Abdominal: No organomegaly, abdomen is soft and non-tender, positive bowel sounds.  No masses. 1x2 cm umbilical hernia GU: Prostate exam nl Musculoskeletal: Gait intact. No edema, tenderness Skin: No rashes. Neurologic: Facial musculature symmetric. Psychiatric: Patient acts appropriately throughout our interaction. Lymphatic: No cervical or submandibular lymphadenopathy  LABS: Results for orders placed or performed in visit on 03/08/16  POCT urinalysis dipstick  Result Value Ref Range   Color, UA yellow yellow   Clarity, UA clear clear   Glucose, UA negative negative   Bilirubin, UA negative negative   Ketones, POC UA negative negative   Spec Grav, UA 1.020    Blood, UA negative negative   pH, UA 7.5    Protein Ur, POC negative negative   Urobilinogen, UA 2.0    Nitrite, UA Negative Negative  Leukocytes, UA Negative Negative   EKG/XRAY:   Primary read interpreted by Dr. Everlene Farrier at [facility].   ASSESSMENT/   Patient is in excellent health. There will be no changes in medications. Referral made to Alphonsa Overall for umbilical hernia repair.I personally performed the services described in this documentation, which was scribed in my presence. The recorded information has been reviewed and is accurate.Johney Maine sideeffects, risk and benefits, and alternatives of medications d/w patient. Patient is aware  that all medications have potential sideeffects and we are unable to predict every sideeffect or drug-drug interaction that may occur.  [name] MD 03/08/2016 2:04 PM

## 2016-03-08 NOTE — Patient Instructions (Signed)

## 2016-03-09 LAB — HEPATITIS C ANTIBODY: HCV AB: NEGATIVE

## 2016-03-09 LAB — PSA: PSA: 2.52 ng/mL (ref <=4.00)

## 2016-03-12 ENCOUNTER — Other Ambulatory Visit: Payer: Self-pay | Admitting: Emergency Medicine

## 2016-03-16 MED ORDER — ATORVASTATIN CALCIUM 40 MG PO TABS: ORAL_TABLET | ORAL | Status: DC

## 2016-03-16 NOTE — Addendum Note (Signed)
Addended by: Jannette Spanner on: 03/16/2016 02:04 PM   Modules accepted: Orders

## 2016-03-27 ENCOUNTER — Encounter: Payer: Federal, State, Local not specified - PPO | Admitting: Emergency Medicine

## 2016-03-27 DIAGNOSIS — K080 Exfoliation of teeth due to systemic causes: Secondary | ICD-10-CM | POA: Diagnosis not present

## 2016-03-29 DIAGNOSIS — K429 Umbilical hernia without obstruction or gangrene: Secondary | ICD-10-CM | POA: Diagnosis not present

## 2016-09-14 DIAGNOSIS — D2230 Melanocytic nevi of unspecified part of face: Secondary | ICD-10-CM | POA: Diagnosis not present

## 2016-09-14 DIAGNOSIS — D225 Melanocytic nevi of trunk: Secondary | ICD-10-CM | POA: Diagnosis not present

## 2016-09-14 DIAGNOSIS — L821 Other seborrheic keratosis: Secondary | ICD-10-CM | POA: Diagnosis not present

## 2016-09-24 ENCOUNTER — Other Ambulatory Visit: Payer: Self-pay | Admitting: Emergency Medicine

## 2016-09-26 ENCOUNTER — Telehealth: Payer: Self-pay

## 2016-09-26 NOTE — Telephone Encounter (Signed)
Patient is calling to check on his Lipitor refill.  He states that he spoke with Pamala Hurry earlier about the medication.  Please advise  [phone]

## 2016-09-27 MED ORDER — ATORVASTATIN CALCIUM 40 MG PO TABS: ORAL_TABLET | ORAL | 0 refills | Status: DC

## 2016-09-27 NOTE — Telephone Encounter (Signed)
Called pt back and discussed need to RTC for f/up visit and to est care w/Dr Carlota Raspberry since Dr Perfecto Kingdom retirement. Sent in 1 mos RF and pt agreed to [facility] and sch appt.

## 2016-10-08 ENCOUNTER — Ambulatory Visit (INDEPENDENT_AMBULATORY_CARE_PROVIDER_SITE_OTHER): Payer: Federal, State, Local not specified - PPO | Admitting: Family Medicine

## 2016-10-08 VITALS — BP 126/88 | HR 63 | Temp 98.6°F | Resp 17 | Ht 72.0 in | Wt 225.0 lb

## 2016-10-08 DIAGNOSIS — Z23 Encounter for immunization: Secondary | ICD-10-CM | POA: Diagnosis not present

## 2016-10-08 DIAGNOSIS — E785 Hyperlipidemia, unspecified: Secondary | ICD-10-CM | POA: Diagnosis not present

## 2016-10-08 MED ORDER — ATORVASTATIN CALCIUM 40 MG PO TABS
ORAL_TABLET | ORAL | 1 refills | Status: DC
Start: 2016-10-08 — End: 2017-03-07

## 2016-10-08 NOTE — Patient Instructions (Addendum)
Follow-up in 6 months for a physical. Let me know if you have any questions in the meantime    IF you received an x-ray today, you will receive an invoice from [facility] Radiology. Please contact [facility] Radiology at [phone] with questions or concerns regarding your invoice.   IF you received labwork today, you will receive an invoice from [employer]. Please contact [facility] at [phone] with questions or concerns regarding your invoice.   Our billing staff will not be able to assist you with questions regarding bills from these companies.  You will be contacted with the lab results as soon as they are available. The fastest way to get your results is to activate your My Chart account. Instructions are located on the last page of this paperwork. If you have not heard from [place] regarding the results in 2 weeks, please contact this office.

## 2016-10-08 NOTE — Progress Notes (Signed)
Subjective:  By signing my name below, I, Ralph Hayes, attest that this documentation has been prepared under the direction and in the presence of [name], MD.  Electronically Signed: Thea Alken, ED Scribe. 10/08/2016. 8:12 AM.   Patient ID: Ralph Hayes, male    DOB: 1956-03-02, 60 y.o.   MRN: [id number]  HPI Chief Complaint  Patient presents with  . Medication Refill    Lipitor    HPI Comments: Ralph Hayes is a 61 y.o. male who presents to the [facility] to establish care, as well as a medication refill. Previous pt of Dr. Everlene Farrier. Hx of HLD. He is on Lipitor 40 mg qd.   Lab Results  Component Value Date   CHOL 153 03/08/2016   HDL 50 03/08/2016   LDLCALC 81 03/08/2016   TRIG 111 03/08/2016   CHOLHDL 3.1 03/08/2016   Lab Results  Component Value Date   ALT 32 03/08/2016   AST 19 03/08/2016   ALKPHOS 72 03/08/2016   BILITOT 1.2 03/08/2016   He was last seen 5/11 for a physical. Continued on same medication a that time. He was referred to general surgery for general hernia. Pt has followed up with Dr. Lucia Gaskins.    Pt denies heart issues in the past. He denies adverse effects with medication including muscle aches. Pt exercises regularly including yoga.  He denies CP, fatigue and SOB with exercise.  He denies abdominal pain and blood in stool.    Patient Active Problem List   Diagnosis Date Noted  . Cervical disc disorder with radiculopathy of cervical region 11/17/2014  . 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 Bradycardia 05/04/2014   Past Medical History:  Diagnosis Date  . Hyperlipidemia    No past surgical history on file. Allergies  Allergen Reactions  . Penicillins     Swell   Prior to Admission medications   Medication Sig Start Date End Date Taking? Authorizing Provider  aspirin 81 MG tablet Take 81 mg by mouth daily.    Historical Provider, MD  atorvastatin (LIPITOR) 40 MG tablet TAKE 1 TABLET BY MOUTH EVERY DAY.    "OFFICE VISIT NEEDED FOR REFILLS" 09/27/16   Harrison Mons, PA-C   Social History   Social History  . Marital status: Married    Spouse name: N/A  . Number of children: N/A  . Years of education: N/A   Occupational History  . Not on file.   Social History Main Topics  . Smoking status: Never Smoker  . Smokeless tobacco: Not on file  . Alcohol use Yes     Comment: twice a wk  . Drug use: No  . Sexual activity: Yes   Other Topics Concern  . Not on file   Social History Narrative   Married. Education: [employer].    Review of Systems  Constitutional: Negative for fatigue and unexpected weight change.  Eyes: Negative for visual disturbance.  Respiratory: Negative for cough, chest tightness and shortness of breath.   Cardiovascular: Negative for chest pain, palpitations and leg swelling.  Gastrointestinal: Negative for abdominal pain and blood in stool.  Neurological: Negative for dizziness, light-headedness and headaches.       Objective:   Physical Exam  Constitutional: He is oriented to person, place, and time. He appears well-developed and well-nourished.  HENT:  Head: Normocephalic and atraumatic.  Eyes: EOM are normal. Pupils are equal, round, and reactive to light.  Neck: No JVD present. Carotid bruit is not  present.  Cardiovascular: Normal rate, regular rhythm and normal heart sounds.   No murmur heard. Pulmonary/Chest: Effort normal and breath sounds normal. He has no rales.  Musculoskeletal: He exhibits no edema.  Neurological: He is alert and oriented to person, place, and time.  Skin: Skin is warm and dry.  Psychiatric: He has a normal mood and affect.  Vitals reviewed.   Vitals:   10/08/16 0812  BP: 126/88  Pulse: 63  Resp: 17  Temp: 98.6 F (37 C)  TempSrc: Oral  SpO2: 97%  Weight: 225 lb (102.1 kg)  Height: 6' (1.829 m)      Assessment & Plan:   Ralph Hayes is a 60 y.o. male Hyperlipidemia, unspecified hyperlipidemia type - Plan:  Comprehensive metabolic panel, Lipid panel  - Tolerating Lipitor at current dose. Refilled. Labs pending. Did have some half-and-half in the coffee this morning, so if elevated lipids, especially triglycerides, may choose to recheck that at physical in 6 months.  Needs flu shot - Plan: Flu Vaccine QUAD 36+ mos IM given   Meds ordered this encounter  Medications  . atorvastatin (LIPITOR) 40 MG tablet    Sig: TAKE 1 TABLET BY MOUTH EVERY DAY.    Dispense:  90 tablet    Refill:  1   Patient Instructions   Follow-up in 6 months for a physical. Let me know if you have any questions in the meantime    IF you received an x-ray today, you will receive an invoice from [facility] Radiology. Please contact [facility] Radiology at [phone] with questions or concerns regarding your invoice.   IF you received labwork today, you will receive an invoice from [employer]. Please contact [facility] at [phone] with questions or concerns regarding your invoice.   Our billing staff will not be able to assist you with questions regarding bills from these companies.  You will be contacted with the lab results as soon as they are available. The fastest way to get your results is to activate your My Chart account. Instructions are located on the last page of this paperwork. If you have not heard from [place] regarding the results in 2 weeks, please contact this office.        I personally performed the services described in this documentation, which was scribed in my presence. The recorded information has been reviewed and considered, and addended by me as needed.   Signed,   [name], MD [facility].  10/08/16 8:25 AM

## 2016-10-09 LAB — COMPREHENSIVE METABOLIC PANEL
ALT: 33 IU/L (ref 0–44)
AST: 23 IU/L (ref 0–40)
Albumin/Globulin Ratio: 2.7 — ABNORMAL HIGH (ref 1.2–2.2)
Albumin: 4.6 g/dL (ref 3.5–5.5)
Alkaline Phosphatase: 95 IU/L (ref 39–117)
BUN/Creatinine Ratio: 22 — ABNORMAL HIGH (ref 9–20)
BUN: 22 mg/dL (ref 6–24)
Bilirubin Total: 1.2 mg/dL (ref 0.0–1.2)
CALCIUM: 9.1 mg/dL (ref 8.7–10.2)
CO2: 24 mmol/L (ref 18–29)
CREATININE: 0.99 mg/dL (ref 0.76–1.27)
Chloride: 103 mmol/L (ref 96–106)
GFR, EST AFRICAN AMERICAN: 96 mL/min/{1.73_m2} (ref >59)
GFR, EST NON AFRICAN AMERICAN: 83 mL/min/{1.73_m2} (ref >59)
GLUCOSE: 122 mg/dL — AB (ref 65–99)
Globulin, Total: 1.7 g/dL (ref 1.5–4.5)
Potassium: 4.4 mmol/L (ref 3.5–5.2)
Sodium: 144 mmol/L (ref 134–144)
TOTAL PROTEIN: 6.3 g/dL (ref 6.0–8.5)

## 2016-10-09 LAB — LIPID PANEL
Chol/HDL Ratio: 3.7 ratio units (ref 0.0–5.0)
Cholesterol, Total: 193 mg/dL (ref 100–199)
HDL: 52 mg/dL (ref >39)
LDL Calculated: 110 mg/dL — ABNORMAL HIGH (ref 0–99)
Triglycerides: 157 mg/dL — ABNORMAL HIGH (ref 0–149)
VLDL CHOLESTEROL CAL: 31 mg/dL (ref 5–40)

## 2016-11-08 DIAGNOSIS — Z9889 Other specified postprocedural states: Secondary | ICD-10-CM | POA: Diagnosis not present

## 2016-11-08 DIAGNOSIS — H35362 Drusen (degenerative) of macula, left eye: Secondary | ICD-10-CM | POA: Diagnosis not present

## 2016-11-13 DIAGNOSIS — K080 Exfoliation of teeth due to systemic causes: Secondary | ICD-10-CM | POA: Diagnosis not present

## 2016-12-07 ENCOUNTER — Other Ambulatory Visit: Payer: Self-pay | Admitting: Surgery

## 2016-12-07 DIAGNOSIS — K429 Umbilical hernia without obstruction or gangrene: Secondary | ICD-10-CM | POA: Diagnosis not present

## 2016-12-24 ENCOUNTER — Encounter (HOSPITAL_COMMUNITY)
Admission: RE | Admit: 2016-12-24 | Discharge: 2016-12-24 | Disposition: A | Discharge status: Home / Self Care | Payer: Federal, State, Local not specified - PPO | Source: Ambulatory Visit | Attending: Surgery | Admitting: Surgery

## 2016-12-24 ENCOUNTER — Encounter (HOSPITAL_COMMUNITY): Payer: Self-pay

## 2016-12-24 DIAGNOSIS — K429 Umbilical hernia without obstruction or gangrene: Secondary | ICD-10-CM | POA: Diagnosis not present

## 2016-12-24 DIAGNOSIS — E785 Hyperlipidemia, unspecified: Secondary | ICD-10-CM | POA: Insufficient documentation

## 2016-12-24 DIAGNOSIS — Z01812 Encounter for preprocedural laboratory examination: Secondary | ICD-10-CM | POA: Diagnosis not present

## 2016-12-24 DIAGNOSIS — R9431 Abnormal electrocardiogram [ECG] [EKG]: Secondary | ICD-10-CM | POA: Insufficient documentation

## 2016-12-24 DIAGNOSIS — Z01818 Encounter for other preprocedural examination: Secondary | ICD-10-CM | POA: Insufficient documentation

## 2016-12-24 DIAGNOSIS — Z7982 Long term (current) use of aspirin: Secondary | ICD-10-CM | POA: Insufficient documentation

## 2016-12-24 DIAGNOSIS — R001 Bradycardia, unspecified: Secondary | ICD-10-CM | POA: Insufficient documentation

## 2016-12-24 DIAGNOSIS — Z79899 Other long term (current) drug therapy: Secondary | ICD-10-CM | POA: Diagnosis not present

## 2016-12-24 HISTORY — DX: Other specified health status: Z78.9

## 2016-12-24 LAB — CBC
HCT: 44.4 % (ref 39.0–52.0)
Hemoglobin: 14.3 g/dL (ref 13.0–17.0)
MCH: 28.6 pg (ref 26.0–34.0)
MCHC: 32.2 g/dL (ref 30.0–36.0)
MCV: 88.8 fL (ref 78.0–100.0)
PLATELETS: 198 10*3/uL (ref 150–400)
RBC: 5 MIL/uL (ref 4.22–5.81)
RDW: 13.6 % (ref 11.5–15.5)
WBC: 6.3 10*3/uL (ref 4.0–10.5)

## 2016-12-24 NOTE — Progress Notes (Signed)
Anesthesia Chart Review:  Pt is a 61 year old male scheduled for laparoscopic umbilical hernia repair on 12/28/2016 with Alphonsa Overall, MD.   - PCP is [name], MD - Was evaluated at [facility] in 2016 for chest pain, stress test and echo ordered, results below; does not routinely see cardiology.   PMH includes:  Hyperlipidemia.  Medications include ASA 81 mg, Lipitor.  Preoperative labs reviewed.    EKG 12/24/16: Sinus bradycardia (47 bpm). Left axis deviation. Moderate voltage criteria for LVH, may be normal variant  Exercise tolerance test 05/09/15: Achieved 10.16 METs of work. Normal BP response. Resting EKG shows NSR. No arrhythmias. At peak exercise there was a 1.5 mm upsloping ST depression normalized at less than 1 minute into recovery. No ischemia. Stress terminated due to target heart rate met. Excellent effort.  Echo 04/13/15:  1. LV cavity normal in size. Normal global wall motion. EF 66%. 2. LA cavity slightly dilated. 3. Mild to moderate mitral regurgitation. 4. Mild tricuspid regurgitation. No evidence of pulmonary hypertension.  If no changes, I anticipate pt can proceed with surgery as scheduled.   Willeen Cass, FNP-BC [facility] Short Stay Surgical Center/Anesthesiology Phone: [phone] 12/24/2016 5:05 PM

## 2016-12-24 NOTE — Pre-Procedure Instructions (Addendum)
Ralph Hayes  12/24/2016      [id number] [address], [place] [address] [place] [address] Phone: [phone] Fax: [phone]    Your procedure is scheduled on 12/28/16  Report to [facility] Admitting at 930 A.M.  Call this number if you have problems the morning of surgery:  [phone]   Remember:  Do not eat food or drink liquids after midnight.  Take these medicines the morning of surgery with A SIP OF WATER   No meds- drink boost at 730 liquid(given to you at preop visit)  STOP all herbel meds, nsaids (aleve,naproxen,advil,ibuprofen)  Starting Today including all vitamins/supplements(coq10),aspirin   Do not wear jewelry, make-up or nail polish.  Do not wear lotions, powders, or perfumes, or deoderant.  Do not shave 48 hours prior to surgery.  Men may shave face and neck.  Do not bring valuables to the hospital.  [facility] is not responsible for any belongings or valuables.  Contacts, dentures or bridgework may not be worn into surgery.  Leave your suitcase in the car.  After surgery it may be brought to your room.  For patients admitted to the hospital, discharge time will be determined by your treatment team.  Patients discharged the day of surgery will not be allowed to drive home.   Special instructions:   Special Instructions: [place] - Preparing for Surgery  Before surgery, you can play an important role.  Because skin is not sterile, your skin needs to be as free of germs as possible.  You can reduce the number of germs on you skin by washing with CHG (chlorahexidine gluconate) soap before surgery.  CHG is an antiseptic cleaner which kills germs and bonds with the skin to continue killing germs even after washing.  Please DO NOT use if you have an allergy to CHG or antibacterial soaps.  If your skin becomes reddened/irritated stop using the CHG and inform your nurse when you arrive at Short  Stay.  Do not shave (including legs and underarms) for at least 48 hours prior to the first CHG shower.  You may shave your face.  Please follow these instructions carefully:   1.  Shower with CHG Soap the night before surgery and the morning of Surgery.  2.  If you choose to wash your hair, wash your hair first as usual with your normal shampoo.  3.  After you shampoo, rinse your hair and body thoroughly to remove the Shampoo.  4.  Use CHG as you would any other liquid soap.  You can apply chg directly  to the skin and wash gently with scrungie or a clean washcloth.  5.  Apply the CHG Soap to your body ONLY FROM THE NECK DOWN.  Do not use on open wounds or open sores.  Avoid contact with your eyes ears, mouth and genitals (private parts).  Wash genitals (private parts)       with your normal soap.  6.  Wash thoroughly, paying special attention to the area where your surgery will be performed.  7.  Thoroughly rinse your body with warm water from the neck down.  8.  DO NOT shower/wash with your normal soap after using and rinsing off the CHG Soap.  9.  Pat yourself dry with a clean towel.            10.  Wear clean pajamas.  11.  Place clean sheets on your bed the night of your first shower and do not sleep with pets.  Day of Surgery  Do not apply any lotions/deodorants the morning of surgery.  Please wear clean clothes to the hospital/surgery center.  Please read over the  fact sheets that you were given.

## 2016-12-27 NOTE — H&P (Signed)
Ralph Hayes  Location: [facility] Patient #: [id number] DOB: 02/27/1956 Married / Language: English / Race: White Male  History of Present Illness   The patient is a 61 year old male who presents with a complaint of umbilical hernia.  His PCP is Dr. Chauncey Cruel. Everlene Farrier.  He comes by himself.  I saw him on [phone] for the umbilical hernia. He meant to have this done a month or two ago, but my schedule was busy (as was his). He does not think that the hernia has changed. It does bother his some. He is still interested in getting it repaired. Nothing else has changed. And I have given him a booklet about hernias. We will schedule the surgery  History of umbilical hernia: He has noticed an umbilical hernia for [id number] years. He had a CT scan done on 01/25/2014 which did show a small umbilical hernia - it was not mentioned in the report. Over the last year, he thinks the hernia may be a little larger and is causing some discomfort. He was worried about incarceration of the hernia. He's had no prior stomach disease, liver disease, colon disease. He's had no prior abdominal surgery. He had a colonoscopy in 2009 at the by our GI, but cannot remember who did the colonoscopy.  I discussed the indications and complications of hernia surgery with the patient. I discussed both the laparoscopic and open approach to hernia repair. The potential risks of hernia surgery include, but are not limited to, bleeding, infection, open surgery, nerve injury, and recurrence of the hernia. I provided the patient literature about hernia surgery. The hernia is a little large for sutures alone, so I would fix him laparoscopically.  Past Medical History 1. Hyperlipidemia 2. He his really in good health.  Social History:  Married. Works for [employer] as an Chief Financial Officer He has two daughtes - 25 and 24. His 41 yo daughter has two children and live here in  [place].   Allergies Bary Castilla [place], [place]; 12/07/2016 2:18 PM) PenicillAMINE *ASSORTED CLASSES*   Medication History Nance Pear, CMA; 12/07/2016 2:18 PM) Atorvastatin Calcium (40MG  Tablet, Oral) Active. Aspirin (81MG  Tablet Chewable, Oral) Active. Medications Reconciled  Vitals (Sade Bradford CMA; 12/07/2016 2:19 PM) 12/07/2016 2:18 PM Weight: 228.4 lb Height: 72in Body Surface Area: 2.25 m Body Mass Index: 30.98 kg/m  Temp.: 98.41F  Pulse: 79 (Regular)  BP: 118/82 (Sitting, Left Arm, Standard)   Physical Exam  General: WN WM alert and generally healthy appearing. HEENT: Normal. Pupils equal.  Neck: Supple. No mass. No thyroid mass.  Lymph Nodes: No supraclavicular or cervical nodes.  Lungs: Clear to auscultation and symmetric breath sounds. Heart: RRR. No murmur or rub.  Abdomen: Soft. No mass. No tenderness. Normal bowel sounds. No abdominal scars.   He has an umbilical hernia - about 2 cm. The hernia reduces easily in the supine position.  It has not changed much since I last saw him.  Extremities: Good strength and ROM in upper and lower extremities.  Neurologic: Grossly intact to motor and sensory function. Psychiatric: Has normal mood and affect. Behavior is normal.  Assessment & Plan  1.  UMBILICAL HERNIA WITHOUT OBSTRUCTION OR GANGRENE (K42.9)  Plan:  1) Will proceed with laparoscopic umbilical hernia repair  2. Hyperlipidemia  Alphonsa Overall, MD, [facility] Pager: [phone] Office phone:  [phone]

## 2016-12-28 ENCOUNTER — Encounter (HOSPITAL_COMMUNITY)
Admission: RE | Disposition: A | Discharge status: Home / Self Care | Payer: Self-pay | Source: Ambulatory Visit | Attending: Surgery

## 2016-12-28 ENCOUNTER — Ambulatory Visit (HOSPITAL_COMMUNITY)
Admission: RE | Admit: 2016-12-28 | Discharge: 2016-12-28 | Disposition: A | Discharge status: Home / Self Care | Payer: Federal, State, Local not specified - PPO | Source: Ambulatory Visit | Attending: Surgery | Admitting: Surgery

## 2016-12-28 ENCOUNTER — Ambulatory Visit (HOSPITAL_COMMUNITY): Payer: Federal, State, Local not specified - PPO | Admitting: Emergency Medicine

## 2016-12-28 ENCOUNTER — Encounter (HOSPITAL_COMMUNITY): Payer: Self-pay | Admitting: Anesthesiology

## 2016-12-28 ENCOUNTER — Ambulatory Visit (HOSPITAL_COMMUNITY): Payer: Federal, State, Local not specified - PPO | Admitting: Anesthesiology

## 2016-12-28 DIAGNOSIS — M1990 Unspecified osteoarthritis, unspecified site: Secondary | ICD-10-CM | POA: Diagnosis not present

## 2016-12-28 DIAGNOSIS — E785 Hyperlipidemia, unspecified: Secondary | ICD-10-CM | POA: Insufficient documentation

## 2016-12-28 DIAGNOSIS — Z7982 Long term (current) use of aspirin: Secondary | ICD-10-CM | POA: Insufficient documentation

## 2016-12-28 DIAGNOSIS — M5010 Cervical disc disorder with radiculopathy, unspecified cervical region: Secondary | ICD-10-CM | POA: Diagnosis not present

## 2016-12-28 DIAGNOSIS — Z79899 Other long term (current) drug therapy: Secondary | ICD-10-CM | POA: Insufficient documentation

## 2016-12-28 DIAGNOSIS — K429 Umbilical hernia without obstruction or gangrene: Secondary | ICD-10-CM | POA: Insufficient documentation

## 2016-12-28 DIAGNOSIS — M19019 Primary osteoarthritis, unspecified shoulder: Secondary | ICD-10-CM | POA: Diagnosis not present

## 2016-12-28 DIAGNOSIS — R001 Bradycardia, unspecified: Secondary | ICD-10-CM | POA: Diagnosis not present

## 2016-12-28 HISTORY — PX: UMBILICAL HERNIA REPAIR: SHX196

## 2016-12-28 SURGERY — REPAIR, HERNIA, UMBILICAL, LAPAROSCOPIC
Anesthesia: General | Site: Abdomen

## 2016-12-28 MED ORDER — FENTANYL CITRATE (PF) 100 MCG/2ML IJ SOLN
INTRAMUSCULAR | Status: AC
Start: 2016-12-28 — End: ?
  Filled 2016-12-28: qty 4

## 2016-12-28 MED ORDER — HYDROMORPHONE HCL 1 MG/ML IJ SOLN
0.2500 mg | INTRAMUSCULAR | Status: DC | PRN
  Administered 2016-12-28: 0.5 mg via INTRAVENOUS

## 2016-12-28 MED ORDER — EPHEDRINE SULFATE 50 MG/ML IJ SOLN
INTRAMUSCULAR | Status: DC | PRN
  Administered 2016-12-28: 10 mg via INTRAVENOUS

## 2016-12-28 MED ORDER — CEFAZOLIN SODIUM-DEXTROSE 2-4 GM/100ML-% IV SOLN
INTRAVENOUS | Status: AC
  Filled 2016-12-28: qty 100

## 2016-12-28 MED ORDER — PROMETHAZINE HCL 25 MG/ML IJ SOLN: 6.2500 mg | INTRAMUSCULAR | Status: DC | PRN

## 2016-12-28 MED ORDER — SUGAMMADEX SODIUM 500 MG/5ML IV SOLN
INTRAVENOUS | Status: AC
Start: 2016-12-28 — End: ?
  Filled 2016-12-28: qty 5

## 2016-12-28 MED ORDER — ACETAMINOPHEN 500 MG PO TABS
1000.0000 mg | ORAL_TABLET | ORAL | Status: AC
  Administered 2016-12-28: 1000 mg via ORAL

## 2016-12-28 MED ORDER — CEFAZOLIN SODIUM-DEXTROSE 2-4 GM/100ML-% IV SOLN
2.0000 g | INTRAVENOUS | Status: AC
Start: 2016-12-28 — End: 2016-12-28
  Administered 2016-12-28: 2 g via INTRAVENOUS

## 2016-12-28 MED ORDER — MIDAZOLAM HCL 2 MG/2ML IJ SOLN
INTRAMUSCULAR | Status: AC
  Filled 2016-12-28: qty 2

## 2016-12-28 MED ORDER — MEPERIDINE HCL 25 MG/ML IJ SOLN: 6.2500 mg | INTRAMUSCULAR | Status: DC | PRN

## 2016-12-28 MED ORDER — MIDAZOLAM HCL 5 MG/5ML IJ SOLN
INTRAMUSCULAR | Status: DC | PRN
  Administered 2016-12-28: 2 mg via INTRAVENOUS

## 2016-12-28 MED ORDER — OXYCODONE HCL 5 MG PO TABS
5.0000 mg | ORAL_TABLET | Freq: Once | ORAL | Status: AC
  Administered 2016-12-28: 5 mg via ORAL

## 2016-12-28 MED ORDER — HYDROMORPHONE HCL 1 MG/ML IJ SOLN
INTRAMUSCULAR | Status: AC
  Filled 2016-12-28: qty 0.5

## 2016-12-28 MED ORDER — LIDOCAINE HCL (CARDIAC) 20 MG/ML IV SOLN
INTRAVENOUS | Status: DC | PRN
  Administered 2016-12-28: 60 mg via INTRAVENOUS

## 2016-12-28 MED ORDER — HYDROCODONE-ACETAMINOPHEN 5-325 MG PO TABS: 1.0000 | ORAL_TABLET | Freq: Four times a day (QID) | ORAL | 0 refills | Status: DC | PRN

## 2016-12-28 MED ORDER — LACTATED RINGERS IV SOLN
INTRAVENOUS | Status: DC | PRN
  Administered 2016-12-28 (×2): via INTRAVENOUS

## 2016-12-28 MED ORDER — PROPOFOL 10 MG/ML IV BOLUS
INTRAVENOUS | Status: AC
  Filled 2016-12-28: qty 20

## 2016-12-28 MED ORDER — LACTATED RINGERS IV SOLN: INTRAVENOUS | Status: DC

## 2016-12-28 MED ORDER — FENTANYL CITRATE (PF) 100 MCG/2ML IJ SOLN
INTRAMUSCULAR | Status: DC | PRN
  Administered 2016-12-28 (×3): 50 ug via INTRAVENOUS

## 2016-12-28 MED ORDER — CHLORHEXIDINE GLUCONATE CLOTH 2 % EX PADS: 6.0000 | MEDICATED_PAD | Freq: Once | CUTANEOUS | Status: DC

## 2016-12-28 MED ORDER — ROCURONIUM BROMIDE 100 MG/10ML IV SOLN
INTRAVENOUS | Status: DC | PRN
  Administered 2016-12-28: 50 mg via INTRAVENOUS

## 2016-12-28 MED ORDER — ACETAMINOPHEN 500 MG PO TABS
ORAL_TABLET | ORAL | Status: AC
  Administered 2016-12-28: 1000 mg via ORAL
  Filled 2016-12-28: qty 2

## 2016-12-28 MED ORDER — ONDANSETRON HCL 4 MG/2ML IJ SOLN
INTRAMUSCULAR | Status: DC | PRN
  Administered 2016-12-28: 4 mg via INTRAVENOUS

## 2016-12-28 MED ORDER — 0.9 % SODIUM CHLORIDE (POUR BTL) OPTIME
TOPICAL | Status: DC | PRN
  Administered 2016-12-28: 1000 mL

## 2016-12-28 MED ORDER — SUGAMMADEX SODIUM 200 MG/2ML IV SOLN
INTRAVENOUS | Status: DC | PRN
Start: 2016-12-28 — End: 2016-12-28
  Administered 2016-12-28: 414 mg via INTRAVENOUS

## 2016-12-28 MED ORDER — GABAPENTIN 300 MG PO CAPS
300.0000 mg | ORAL_CAPSULE | ORAL | Status: AC
  Administered 2016-12-28: 300 mg via ORAL

## 2016-12-28 MED ORDER — BUPIVACAINE-EPINEPHRINE 0.5% -1:200000 IJ SOLN
INTRAMUSCULAR | Status: DC | PRN
  Administered 2016-12-28: 20 mL

## 2016-12-28 MED ORDER — BUPIVACAINE-EPINEPHRINE (PF) 0.5% -1:200000 IJ SOLN
INTRAMUSCULAR | Status: AC
  Filled 2016-12-28: qty 30

## 2016-12-28 MED ORDER — GABAPENTIN 300 MG PO CAPS
ORAL_CAPSULE | ORAL | Status: AC
  Administered 2016-12-28: 300 mg via ORAL
  Filled 2016-12-28: qty 1

## 2016-12-28 MED ORDER — OXYCODONE HCL 5 MG PO TABS
ORAL_TABLET | ORAL | Status: AC
  Filled 2016-12-28: qty 1

## 2016-12-28 MED ORDER — PROPOFOL 10 MG/ML IV BOLUS
INTRAVENOUS | Status: DC | PRN
  Administered 2016-12-28: 150 mg via INTRAVENOUS

## 2016-12-28 MED ORDER — LACTATED RINGERS IV SOLN
INTRAVENOUS | Status: DC
  Administered 2016-12-28: 10:00:00 via INTRAVENOUS

## 2016-12-28 SURGICAL SUPPLY — 41 items
ADH SKN CLS APL DERMABOND .7 (GAUZE/BANDAGES/DRESSINGS) ×1
APPLIER CLIP ROT 10 11.4 M/L (STAPLE)
APR CLP MED LRG 11.4X10 (STAPLE)
BINDER ABD UNIV 10 28-50 (GAUZE/BANDAGES/DRESSINGS) IMPLANT
BINDER ABDOM UNIV 10 (GAUZE/BANDAGES/DRESSINGS) ×2
BINDER ABDOMINAL 12 ML 46-62 (SOFTGOODS) ×2 IMPLANT
BLADE CLIPPER SURG (BLADE) IMPLANT
CANISTER SUCT 3000ML PPV (MISCELLANEOUS) IMPLANT
CLIP APPLIE ROT 10 11.4 M/L (STAPLE) IMPLANT
COVER SURGICAL LIGHT HANDLE (MISCELLANEOUS) ×2 IMPLANT
DERMABOND ADVANCED (GAUZE/BANDAGES/DRESSINGS) ×1
DERMABOND ADVANCED .7 DNX12 (GAUZE/BANDAGES/DRESSINGS) IMPLANT
DEVICE SECURE STRAP 25 ABSORB (INSTRUMENTS) ×3 IMPLANT
DEVICE TROCAR PUNCTURE CLOSURE (ENDOMECHANICALS) ×2 IMPLANT
DRAPE INCISE IOBAN 66X45 STRL (DRAPES) ×2 IMPLANT
DRAPE LAPAROSCOPIC ABDOMINAL (DRAPES) ×2 IMPLANT
ELECT REM PT RETURN 9FT ADLT (ELECTROSURGICAL) ×2
ELECTRODE REM PT RTRN 9FT ADLT (ELECTROSURGICAL) ×1 IMPLANT
GLOVE SURG SIGNA 7.5 PF LTX (GLOVE) ×2 IMPLANT
GOWN STRL REUS W/ TWL LRG LVL3 (GOWN DISPOSABLE) ×2 IMPLANT
GOWN STRL REUS W/ TWL XL LVL3 (GOWN DISPOSABLE) ×1 IMPLANT
GOWN STRL REUS W/TWL LRG LVL3 (GOWN DISPOSABLE) ×4
GOWN STRL REUS W/TWL XL LVL3 (GOWN DISPOSABLE) ×2
KIT BASIN OR (CUSTOM PROCEDURE TRAY) ×2 IMPLANT
KIT ROOM TURNOVER OR (KITS) ×2 IMPLANT
MESH PARIETEX 4.7 (Mesh General) ×1 IMPLANT
NDL SPNL 22GX3.5 QUINCKE BK (NEEDLE) ×1 IMPLANT
NEEDLE SPNL 22GX3.5 QUINCKE BK (NEEDLE) ×2 IMPLANT
NS IRRIG 1000ML POUR BTL (IV SOLUTION) ×2 IMPLANT
PAD ARMBOARD 7.5X6 YLW CONV (MISCELLANEOUS) ×4 IMPLANT
SET IRRIG TUBING LAPAROSCOPIC (IRRIGATION / IRRIGATOR) IMPLANT
SHEARS HARMONIC ACE PLUS 36CM (ENDOMECHANICALS) IMPLANT
SUT MON AB 5-0 PS2 18 (SUTURE) ×2 IMPLANT
SUT NOVA 0 T19/GS 22DT (SUTURE) ×4 IMPLANT
TOWEL OR 17X24 6PK STRL BLUE (TOWEL DISPOSABLE) ×2 IMPLANT
TOWEL OR 17X26 10 PK STRL BLUE (TOWEL DISPOSABLE) ×2 IMPLANT
TRAY FOLEY W/METER SILVER 16FR (SET/KITS/TRAYS/PACK) IMPLANT
TRAY LAPAROSCOPIC MC (CUSTOM PROCEDURE TRAY) ×2 IMPLANT
TROCAR XCEL NON-BLD 11X100MML (ENDOMECHANICALS) ×2 IMPLANT
TROCAR XCEL NON-BLD 5MMX100MML (ENDOMECHANICALS) ×4 IMPLANT
TUBING INSUFFLATION (TUBING) ×2 IMPLANT

## 2016-12-28 NOTE — Transfer of Care (Signed)
Immediate Anesthesia Transfer of Care Note  Patient: Ralph Hayes  Procedure(s) Performed: Procedure(s): LAPAROSCOPIC UMBILICAL HERNIA (N/A)  Patient Location: PACU  Anesthesia Type:General  Level of Consciousness: awake, alert  and oriented  Airway & Oxygen Therapy: Patient Spontanous Breathing and Patient connected to nasal cannula oxygen  Post-op Assessment: Report given to RN, Post -op Vital signs reviewed and stable and Patient moving all extremities X 4  Post vital signs: Reviewed and stable  Last Vitals:  Vitals:   12/28/16 0935  BP: (!) 160/91  Pulse: (!) 55  Resp: 18  Temp: 36.7 C    Last Pain:  Vitals:   12/28/16 0935  TempSrc: Oral      Patients Stated Pain Goal: 6 ([id number])  Complications: No apparent anesthesia complications

## 2016-12-28 NOTE — Interval H&P Note (Signed)
History and Physical Interval Note:  12/28/2016 10:57 AM  Ralph Hayes  has presented today for surgery, with the diagnosis of umbilical hernia.  Wife at bedside.    The various methods of treatment have been discussed with the patient and family. After consideration of risks, benefits and other options for treatment, the patient has consented to  Procedure(s): LAPAROSCOPIC UMBILICAL HERNIA (N/A) as a surgical intervention .  The patient's history has been reviewed, patient examined, no change in status, stable for surgery.  I have reviewed the patient's chart and labs.  Questions were answered to the patient's satisfaction.     Debrina Kizer,Coral H

## 2016-12-28 NOTE — Anesthesia Preprocedure Evaluation (Addendum)
Anesthesia Evaluation  Patient identified by MRN, date of birth, ID band Patient awake    Reviewed: Allergy & Precautions, NPO status , Patient's Chart, lab work & pertinent test results  Airway Mallampati: II  TM Distance: >3 FB     Dental  (+) Teeth Intact, Dental Advisory Given   Pulmonary neg pulmonary ROS,    breath sounds clear to auscultation       Cardiovascular negative cardio ROS   Rhythm:Regular Rate:Normal     Neuro/Psych negative neurological ROS  negative psych ROS   GI/Hepatic negative GI ROS, Neg liver ROS,   Endo/Other  negative endocrine ROS  Renal/GU negative Renal ROS  negative genitourinary   Musculoskeletal  (+) Arthritis , Osteoarthritis,    Abdominal   Peds negative pediatric ROS (+)  Hematology negative hematology ROS (+)   Anesthesia Other Findings - HLD  Reproductive/Obstetrics negative OB ROS                            Lab Results  Component Value Date   WBC 6.3 12/24/2016   HGB 14.3 12/24/2016   HCT 44.4 12/24/2016   MCV 88.8 12/24/2016   PLT 198 12/24/2016   Lab Results  Component Value Date   CREATININE 0.99 10/08/2016   BUN 22 10/08/2016   NA 144 10/08/2016   K 4.4 10/08/2016   CL 103 10/08/2016   CO2 24 10/08/2016   No results found for: INR, PROTIME  EKG: normal sinus rhythm.  Anesthesia Physical Anesthesia Plan  ASA: II  Anesthesia Plan: General   Post-op Pain Management:    Induction: Intravenous  Airway Management Planned: Oral ETT  Additional Equipment:   Intra-op Plan:   Post-operative Plan: Extubation in OR  Informed Consent: I have reviewed the patients History and Physical, chart, labs and discussed the procedure including the risks, benefits and alternatives for the proposed anesthesia with the patient or authorized representative who has indicated his/her understanding and acceptance.   Dental advisory  given  Plan Discussed with: CRNA  Anesthesia Plan Comments:         Anesthesia Quick Evaluation

## 2016-12-28 NOTE — Discharge Instructions (Signed)
[facility] - DISCHARGE INSTRUCTIONS TO PATIENT  Return to work on:  4 to 10 days  Activity:  Driving - May drive in 2 or 3 days, if doing well.   Lifting - No lifting more than 15 pounds  Wound Care:   Leave incisions dry for 2 days, then may shower        Wear abdominal binder while out of bed for at least 4 weeks.  Diet:  As tolerated  Follow up appointment:  Call Dr. Pollie Friar office [facility]) at [phone] for an appointment in 2 to 3 weeks  Medications and dosages:  Resume your home medications.  You have a prescription for:  Vicodin  Call Dr. Lucia Gaskins or his office  [phone]) if you have:  Temperature greater than 100.4,  Persistent nausea and vomiting,  Severe uncontrolled pain,  Redness, tenderness, or signs of infection (pain, swelling, redness, odor or green/yellow discharge around the site),  Any other questions or concerns you may have after discharge.  In an emergency, call 911 or go to an Emergency Department at a nearby hospital.

## 2016-12-28 NOTE — Anesthesia Procedure Notes (Signed)
Procedure Name: Intubation Date/Time: 12/28/2016 11:36 AM Performed by: Neldon Newport Pre-anesthesia Checklist: Timeout performed, Patient being monitored, Suction available, Emergency Drugs available and Patient identified Patient Re-evaluated:Patient Re-evaluated prior to inductionOxygen Delivery Method: Circle system utilized Preoxygenation: Pre-oxygenation with 100% oxygen Intubation Type: IV induction Ventilation: Mask ventilation without difficulty Laryngoscope Size: Mac and 3 Grade View: Grade I Tube type: Oral Tube size: 7.5 mm Number of attempts: 1 Placement Confirmation: breath sounds checked- equal and bilateral,  positive ETCO2 and ETT inserted through vocal cords under direct vision Secured at: 22 cm Tube secured with: Tape Dental Injury: Teeth and Oropharynx as per pre-operative assessment

## 2016-12-28 NOTE — Anesthesia Postprocedure Evaluation (Addendum)
Anesthesia Post Note  Patient: Ralph Hayes  Procedure(s) Performed: Procedure(s) (LRB): LAPAROSCOPIC UMBILICAL HERNIA (N/A)  Patient location during evaluation: PACU Anesthesia Type: General Level of consciousness: awake and alert Pain management: pain level controlled Vital Signs Assessment: post-procedure vital signs reviewed and stable Respiratory status: spontaneous breathing, nonlabored ventilation, respiratory function stable and patient connected to nasal cannula oxygen Cardiovascular status: blood pressure returned to baseline and stable Postop Assessment: no signs of nausea or vomiting Anesthetic complications: no       Last Vitals:  Vitals:   12/28/16 1325 12/28/16 1354  BP: 121/78 (!) 150/85  Pulse: (!) 57 (!) 58  Resp: 11 16  Temp:      Last Pain:  Vitals:   12/28/16 1325  TempSrc:   PainSc: 3                  Effie Berkshire

## 2016-12-28 NOTE — Op Note (Signed)
OPERATIVE NOTE  12/28/2016  12:27 PM  PATIENT:  Ralph Hayes, 61 y.o., male, MRN: [id number]  PREOP DIAGNOSIS:  umbilical hernia  POSTOP DIAGNOSIS:   Umbilical hernia  PROCEDURE:   Procedure(s): LAPAROSCOPIC UMBILICAL HERNIA  SURGEON:   Alphonsa Overall, M.D.  ASSISTANT:   Arlana Lindau, PA student  ANESTHESIA:   general  Anesthesiologist: Effie Berkshire, MD CRNA: Neldon Newport, CRNA  General  EBL:  minimal  ml  BLOOD ADMINISTERED: none  DRAINS: none   LOCAL MEDICATIONS USED:   20 cc 1/2% marcaine  SPECIMEN:   none  COUNTS CORRECT:  YES  INDICATIONS FOR PROCEDURE:  MOROCCO EISENBEIS is a 61 y.o. (DOB: 05/04/1956) white male whose primary care physician is DAUB, STEVE A, MD and comes for umbilical hernia repair   The indications and risks of the surgery were explained to the patient.  The risks include, but are not limited to, infection, bleeding, and nerve injury.  OPERATIVE NOTE:  The patient was taken to room 2 at [facility].  He underwent a general anesthesia.  He was given 2 grams of Ancef at the beginning of the operation.   A time out was held and the surgical checklist run.   The abdomen was prepped with Cholroprep and sterilely draped.  I covered the abdomen with a Ioban drape.   I accessed the LUQ with a 5 mm trocar. I later upgraded this to a 10 mm trocar.  An additional 5 mm trocar was placed in the left mid abdomen.   He had a 3.0 x 3.0  cm defect at the umbilicus.  There was no incarceration in the hernia.   Then I placed a 12 x 12 cm Parietex mesh in the abdomen.  It had 4 holding sutures of 0 Novafil.  These were tied down to secure the mesh to the anterior abdominal wall.  I then used 29 Securestrap tacks to tack the mesh to the anterior abdominal wall.   The abdomen was deflated.  The mesh was inspected and there were no defects around the edge.   The trocars were then removed.  There was no bleeding at the trocar sites.  The incisions were  closed with 4-0 Monocryl and painted with DermaBond. The puncture sites were painted with DermaBond.   The sponge and needle count were correct.  The patient had an abdominal binder placed.  The patient was transferred to the recovery room in good condition.   Type of repair - mesh  (choices - primary suture, mesh, or component)  Name of mesh - Parietex composite  Size of mesh - Length 12 cm, Width 12 cm  Mesh overlap - 5 cm  Placement of mesh - beneath fascia into peritoneal cavity  (choices - beneath fascia and into peritoneal cavity, beneath fascia but external to peritoneal cavity, between the muscle and fascia, above or external to fascia)    Mesh in place looking from LUQ toward pelvis    Alphonsa Overall, MD, [facility] Pager: [phone] Office phone:  [phone]

## 2016-12-28 NOTE — Anesthesia Procedure Notes (Signed)
Performed by: Emre Stock F       

## 2016-12-31 ENCOUNTER — Encounter (HOSPITAL_COMMUNITY): Payer: Self-pay | Admitting: Surgery

## 2017-03-07 ENCOUNTER — Encounter: Payer: Self-pay | Admitting: Family Medicine

## 2017-03-07 ENCOUNTER — Ambulatory Visit (INDEPENDENT_AMBULATORY_CARE_PROVIDER_SITE_OTHER): Payer: Federal, State, Local not specified - PPO | Admitting: Family Medicine

## 2017-03-07 VITALS — BP 143/80 | HR 62 | Temp 98.2°F | Resp 18 | Ht 72.24 in | Wt 226.4 lb

## 2017-03-07 DIAGNOSIS — Z0000 Encounter for general adult medical examination without abnormal findings: Secondary | ICD-10-CM | POA: Diagnosis not present

## 2017-03-07 DIAGNOSIS — L237 Allergic contact dermatitis due to plants, except food: Secondary | ICD-10-CM | POA: Diagnosis not present

## 2017-03-07 DIAGNOSIS — M791 Myalgia, unspecified site: Secondary | ICD-10-CM

## 2017-03-07 DIAGNOSIS — Z114 Encounter for screening for human immunodeficiency virus [HIV]: Secondary | ICD-10-CM | POA: Diagnosis not present

## 2017-03-07 DIAGNOSIS — Z125 Encounter for screening for malignant neoplasm of prostate: Secondary | ICD-10-CM | POA: Diagnosis not present

## 2017-03-07 DIAGNOSIS — M79601 Pain in right arm: Secondary | ICD-10-CM | POA: Diagnosis not present

## 2017-03-07 DIAGNOSIS — E785 Hyperlipidemia, unspecified: Secondary | ICD-10-CM

## 2017-03-07 MED ORDER — TRIAMCINOLONE ACETONIDE 0.1 % EX CREA: 1.0000 "application " | TOPICAL_CREAM | Freq: Two times a day (BID) | CUTANEOUS | 0 refills | Status: DC

## 2017-03-07 MED ORDER — ATORVASTATIN CALCIUM 40 MG PO TABS: ORAL_TABLET | ORAL | 1 refills | Status: DC

## 2017-03-07 NOTE — Progress Notes (Signed)
Subjective:  This chart was scribed for Ralph Agreste, MD by Tamsen Roers, at [place] at [facility].  This patient was seen in room 27 and the patient's care was started at 9:10 AM.   Chief Complaint  Patient presents with  . Annual Exam      Patient ID: Ralph Hayes, male    DOB: [birth year], 61 y.o.   MRN: [id number]  HPI HPI Comments: Ralph Hayes is a 61 y.o. male who presents to Primary Care at [facility] for a complete annual physical exam. Last seen December for hyperlipidemia and to establish care.  He is a previous patient of Dr. Everlene Farrier.  He takes Lipitor 40 mg QD for his hyperlipidemia.  Patient was told to stop taking his Lipitor after he had his surgery for his umbilical hernia.  He has not taken it since.  Patients father and aunt both died of an aneurysm.  Patient denies any headaches or vision changes.Was not advised to have any specific screening after family members with aneurysm. Denies any wheezing, SOB, new coughs.    Umbilical hernia:  repaired by Dr. Lucia Gaskins March 2nd.    Muscle aches: Patient notes of right handed muscle aches/ cramping intermittently on his forearm.  Patient is left handed but uses his right hand when engaging in sports/ different activities.  He has noticed that the ache has gotten a "bit" better since he has been off of the Statins.    Skin:  Patient noticed a rash on his left arm- about a week ago (spread to his left side- a couple days ago).  Patient states that he was weed whacking and has been very sensitive to poison ivy since he was " a little kid ".  Denies any rashes on his face or genital region.  He has been using Calamine for relief.   He also has a rash like area ("whitish in color")  on his (right) arm which he will have his dermatologist look at.     Cancer screening Colonscopy 2009 -Dr. Henrene Pastor - 2 colon polyps hyperplastic- repeat in 10 years.  Skin cancer screening: Patient sees his dermatologist about once per year.   He has never had anything removed in the past. (Dr. Delman Cheadle)  Prostate cancer screening:  Patient is willing to have screening for this today.    Lab Results  Component Value Date   PSA 2.52 03/08/2016   PSA 2.56 04/07/2015   PSA 2.63 09/21/2014    Immunizations Immunization History  Administered Date(s) Administered  . Influenza Split 08/05/2012  . Influenza,inj,Quad PF,36+ Mos 09/21/2014, 10/08/2016  . Tdap 11/25/2007  . Varicella 11/25/2007  . Zoster 05/06/2014  Shingrix: Patient would not like a Shingrix vaccination today.    Screening:  Hepatitis C screening: May 2017.  HIV screening: Patient is unable to recall if he has had HIV screening done in the past and is willing to have one today.  He is married and  has been with his wife since they were "kids".    Depression screening:  Depression screen [facility] 2/9 03/07/2017 10/08/2016 03/08/2016 04/07/2015  Decreased Interest 0 0 0 0  Down, Depressed, Hopeless 0 0 0 0  PHQ - 2 Score 0 0 0 0    Vision screening:   Visual Acuity Screening   Right eye Left eye Both eyes  Without correction: 20/25 20/40-2 20/20-1  With correction:     Eye screening recently with Dr. Thurmond Butts due to pressure he felt in  his eye.  There were no concerns and was told to come back in 2 years.      Dentist: Patient has a Pharmacist, community, Dr. Al Corpus and sees him regularly.    Exercise: Patient dances on fridays and does ti- chi on Saturdays.  He plays ping pong twice per week and is usually very active.    Patient Active Problem List   Diagnosis Date Noted  . Cervical disc disorder with radiculopathy of cervical region 11/17/2014  . 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 Bradycardia 05/04/2014   Past Medical History:  Diagnosis Date  . Hyperlipidemia   . Medical history non-contributory    Past Surgical History:  Procedure Laterality Date  . HERNIA REPAIR    . NO PAST SURGERIES    . UMBILICAL HERNIA REPAIR N/A 12/28/2016   Procedure:  LAPAROSCOPIC UMBILICAL HERNIA;  Surgeon: Alphonsa Overall, MD;  Location: [place];  Service: General;  Laterality: N/A;   Allergies  Allergen Reactions  . Penicillins Swelling    SWELLING REACTION UNSPECIFIED   Has patient had a PCN reaction causing immediate rash, facial/tongue/throat swelling, SOB or lightheadedness with hypotension:unsure Has patient had a PCN reaction causing severe rash involving mucus membranes or skin necrosis:unsure Has patient had a PCN reaction that required hospitalization:No Has patient had a PCN reaction occurring within the last 10 years:unsure If all of the above answers are "NO", then may proceed with Cephalosporin use. Childhood reaction   Prior to Admission medications   Medication Sig Start Date End Date Taking? Authorizing Provider  aspirin EC 81 MG tablet Take 81 mg by mouth every morning.   Yes [provider]  Coenzyme Q10 (COQ10 PO) Take 1 capsule by mouth 3 (three) times a week.   Yes [provider]   Social History   Social History  . Marital status: Married    Spouse name: N/A  . Number of children: N/A  . Years of education: N/A   Occupational History  . Not on file.   Social History Main Topics  . Smoking status: Never Smoker  . Smokeless tobacco: Never Used  . Alcohol use 1.2 oz/week    2 Glasses of wine per week     Comment: twice a wk  . Drug use: No  . Sexual activity: Yes   Other Topics Concern  . Not on file   Social History Narrative   Married. Education: [employer].       Review of Systems  Musculoskeletal: Positive for myalgias.  Skin: Positive for rash.  All other systems reviewed and are negative.      Objective:   Physical Exam  Constitutional: He is oriented to person, place, and time. He appears well-developed and well-nourished. No distress.  HENT:  Head: Normocephalic and atraumatic.  Right Ear: External ear normal.  Left Ear: External ear normal.  Mouth/Throat: Oropharynx is clear  and moist.  Eyes: Conjunctivae and EOM are normal. Pupils are equal, round, and reactive to light.  Neck: Normal range of motion. Neck supple. No thyromegaly present.  Cardiovascular: Normal rate, regular rhythm, normal heart sounds and intact distal pulses.   Pulmonary/Chest: Effort normal and breath sounds normal. No respiratory distress. He has no wheezes.  Abdominal: Soft. Bowel sounds are normal. He exhibits no distension. There is no tenderness. Hernia confirmed negative in the right inguinal area and confirmed negative in the left inguinal area.  Genitourinary: Prostate normal.  Musculoskeletal: Normal range of motion. He exhibits no edema or tenderness.  Right  elbow: full ROM. Full ROM at wrist.  forearm non tender.  Left negative tinel, negative finkelstein.   Lymphadenopathy:    He has no cervical adenopathy.  Neurological: He is alert and oriented to person, place, and time. He has normal reflexes.  Skin: Skin is warm and dry.  Few small patches of erythema with central crusting, no active vesicles.  Left flank: few linear patches of erythema, faint papules. Remainder of back and abdomen are unaffected, neck and face are unaffected.    Psychiatric: He has a normal mood and affect. His behavior is normal.  Vitals reviewed.   Vitals:   03/07/17 0839  BP: (!) 143/80  Pulse: 62  Resp: 18  Temp: 98.2 F (36.8 C)  TempSrc: Oral  SpO2: 94%  Weight: 226 lb 6.4 oz (102.7 kg)  Height: 6' 0.24" (1.835 m)       Assessment & Plan:    Ralph Hayes is a 61 y.o. male Annual physical exam  - -anticipatory guidance as below in AVS, screening labs above. Health maintenance items as above in HPI discussed/recommended as applicable.   Hyperlipidemia, unspecified hyperlipidemia type - Plan: atorvastatin (LIPITOR) 40 MG tablet, Lipid panel, Comprehensive metabolic panel  - Restart Lipitor, check CPK, if any worsening arm pain, repeat CPK but doubt due to statin  Contact dermatitis  due to poison ivy - Plan: triamcinolone cream (KENALOG) 0.1 %  - Topical treatment discussed with triamcinolone, but if continues to spread, may need prednisone taper.  Myalgia, Right arm pain - Plan: CK  -As above check CPK. Recheck if persistent symptoms.  Screening for prostate cancer - Plan: PSA  - We discussed pros and cons of prostate cancer screening, and after this discussion, he chose to have screening done. PSA obtained, and no concerning findings on DRE.   Encounter for screening for HIV - Plan: HIV antibody   Meds ordered this encounter  Medications  . atorvastatin (LIPITOR) 40 MG tablet    Sig: TAKE 1 TABLET BY MOUTH EVERY DAY.    Dispense:  90 tablet    Refill:  1  . triamcinolone cream (KENALOG) 0.1 %    Sig: Apply 1 application topically 2 (two) times daily.    Dispense:  30 g    Refill:  0   Patient Instructions   We can discuss other shingles vaccine (Shingrix) next year or sooner if needed.   Colonoscopy due next year.   I will check your muscle enzyme test. If you continue to have pain in that arm, or any increase in symptoms once restarting the statin, return for repeat muscle enzyme test. If cholesterol is elevated, will let you know to restart Lipitor.    Poison Ivy Dermatitis Poison ivy dermatitis is inflammation of the skin that is caused by the allergens on the leaves of the poison ivy plant. The skin reaction often involves redness, swelling, blisters, and extreme itching. What are the causes? This condition is caused by a specific chemical (urushiol) found in the sap of the poison ivy plant. This chemical is sticky and can be easily spread to people, animals, and objects. You can get poison ivy dermatitis by:  Having direct contact with a poison ivy plant.  Touching animals, other people, or objects that have come in contact with poison ivy and have the chemical on them. What increases the risk? This condition is more likely to develop  in:  People who are outdoors often.  People who go outdoors without wearing  protective clothing, such as closed shoes, long pants, and a long-sleeved shirt. What are the signs or symptoms? Symptoms of this condition include:  Redness and itching.  A rash that often includes bumps and blisters. The rash usually appears 48 hours after exposure.  Swelling. This may occur if the reaction is more severe. Symptoms usually last for 1-2 weeks. However, the first time you develop this condition, symptoms may last 3-4 weeks. How is this diagnosed? This condition may be diagnosed based on your symptoms and a physical exam. Your health care provider may also ask you about any recent outdoor activity. How is this treated? Treatment for this condition will vary depending on how severe it is. Treatment may include:  Hydrocortisone creams or calamine lotions to relieve itching.  Oatmeal baths to soothe the skin.  Over-the-counter antihistamine tablets.  Oral steroid medicine for more severe outbreaks. Follow these instructions at home:  Take or apply over-the-counter and prescription medicines only as told by your health care provider.  Wash exposed skin as soon as possible with soap and cold water.  Use hydrocortisone creams or calamine lotion as needed to soothe the skin and relieve itching.  Take oatmeal baths as needed. Use colloidal oatmeal. You can get this at your local pharmacy or grocery store. Follow the instructions on the packaging.  Do not scratch or rub your skin.  While you have the rash, wash clothes right after you wear them. How is this prevented?  Learn to identify the poison ivy plant and avoid contact with the plant. This plant can be recognized by the number of leaves. Generally, poison ivy has three leaves with flowering branches on a single stem. The leaves are typically glossy, and they have jagged edges that come to a point at the front.  If you have been exposed  to poison ivy, thoroughly wash with soap and water right away. You have about 30 minutes to remove the plant resin before it will cause the rash. Be sure to wash under your fingernails because any plant resin there will continue to spread the rash.  When hiking or camping, wear clothes that will help you to avoid exposure on the skin. This includes long pants, a long-sleeved shirt, tall socks, and hiking boots. You can also apply preventive lotion to your skin to help limit exposure.  If you suspect that your clothes or outdoor gear came in contact with poison ivy, rinse them off outside with a garden hose before you bring them inside your house. Contact a health care provider if:  You have open sores in the rash area.  You have more redness, swelling, or pain in the affected area.  You have redness that spreads beyond the rash area.  You have fluid, blood, or pus coming from the affected area.  You have a fever.  You have a rash over a large area of your body.  You have a rash on your eyes, mouth, or genitals.  Your rash does not improve after a few days. Get help right away if:  Your face swells or your eyes swell shut.  You have trouble breathing.  You have trouble swallowing. This information is not intended to replace advice given to you by your health care provider. Make sure you discuss any questions you have with your health care provider. Document Released: 10/12/2000 Document Revised: 03/22/2016 Document Reviewed: 03/23/2015 Elsevier Interactive Patient Education  2017 [place] you healthy  Get these tests  Blood pressure- Have your blood pressure checked once a year by your healthcare provider.  Normal blood pressure is 120/80  Weight- Have your body mass index (BMI) calculated to screen for obesity.  BMI is a measure of body fat based on height and weight. You can also calculate your own BMI at ViewBanking.si.  Cholesterol- Have your  cholesterol checked every year.  Diabetes- Have your blood sugar checked regularly if you have high blood pressure, high cholesterol, have a family history of diabetes or if you are overweight.  Screening for Colon Cancer- Colonoscopy starting at age 5.  Screening may begin sooner depending on your family history and other health conditions. Follow up colonoscopy as directed by your Gastroenterologist.  Screening for Prostate Cancer- Both blood work (PSA) and a rectal exam help screen for Prostate Cancer.  Screening begins at age 69 with African-American men and at age 53 with Caucasian men.  Screening may begin sooner depending on your family history.  Take these medicines  Aspirin- One aspirin daily can help prevent Heart disease and Stroke.  Flu shot- Every fall.  Tetanus- Every 10 years.  Zostavax- Once after the age of 68 to prevent Shingles.  Pneumonia shot- Once after the age of 16; if you are younger than 35, ask your healthcare provider if you need a Pneumonia shot.  Take these steps  Don't smoke- If you do smoke, talk to your doctor about quitting.  For tips on how to quit, go to www.smokefree.gov or call 1-800-QUIT-NOW.  Be physically active- Exercise 5 days a week for at least 30 minutes.  If you are not already physically active start slow and gradually work up to 30 minutes of moderate physical activity.  Examples of moderate activity include walking briskly, mowing the yard, dancing, swimming, bicycling, etc.  Eat a healthy diet- Eat a variety of healthy food such as fruits, vegetables, low fat milk, low fat cheese, yogurt, lean meant, poultry, fish, beans, tofu, etc. For more information go to www.thenutritionsource.org  Drink alcohol in moderation- Limit alcohol intake to less than two drinks a day. Never drink and drive.  Dentist- Brush and floss twice daily; visit your dentist twice a year.  Depression- Your emotional health is as important as your physical health.  If you're feeling down, or losing interest in things you would normally enjoy please talk to your healthcare provider.  Eye exam- Visit your eye doctor every year.  Safe sex- If you may be exposed to a sexually transmitted infection, use a condom.  Seat belts- Seat belts can save your life; always wear one.  Smoke/Carbon Monoxide detectors- These detectors need to be installed on the appropriate level of your home.  Replace batteries at least once a year.  Skin cancer- When out in the sun, cover up and use sunscreen 15 SPF or higher.  Violence- If anyone is threatening you, please tell your healthcare provider.  Living Will/ Health care power of attorney- Speak with your healthcare provider and family.   IF you received an x-ray today, you will receive an invoice from [facility]. Please contact [facility] at [phone] with questions or concerns regarding your invoice.   IF you received labwork today, you will receive an invoice from [facility]. Please contact [facility] at [phone] with questions or concerns regarding your invoice.   Our billing staff will not be able to assist you with questions regarding bills from these companies.  You will be contacted with the lab results as soon as  they are available. The fastest way to get your results is to activate your My Chart account. Instructions are located on the last page of this paperwork. If you have not heard from [place] regarding the results in 2 weeks, please contact this office.       I personally performed the services described in this documentation, which was scribed in my presence. The recorded information has been reviewed and considered for accuracy and completeness, addended by me as needed, and agree with information above.  Signed,   [name], MD [facility] at [place].  03/09/17 10:56 AM

## 2017-03-07 NOTE — Progress Notes (Signed)
   Subjective:    Patient ID: Ralph Hayes, male    DOB: Jan 05, 1956, 61 y.o.   MRN: [id number]  HPI    Review of Systems  Musculoskeletal: Positive for myalgias.       Objective:   Physical Exam        Assessment & Plan:

## 2017-03-07 NOTE — Patient Instructions (Addendum)
We can discuss other shingles vaccine (Shingrix) next year or sooner if needed.   Colonoscopy due next year.   I will check your muscle enzyme test. If you continue to have pain in that arm, or any increase in symptoms once restarting the statin, return for repeat muscle enzyme test. If cholesterol is elevated, will let you know to restart Lipitor.    Poison Ivy Dermatitis Poison ivy dermatitis is inflammation of the skin that is caused by the allergens on the leaves of the poison ivy plant. The skin reaction often involves redness, swelling, blisters, and extreme itching. What are the causes? This condition is caused by a specific chemical (urushiol) found in the sap of the poison ivy plant. This chemical is sticky and can be easily spread to people, animals, and objects. You can get poison ivy dermatitis by:  Having direct contact with a poison ivy plant.  Touching animals, other people, or objects that have come in contact with poison ivy and have the chemical on them. What increases the risk? This condition is more likely to develop in:  People who are outdoors often.  People who go outdoors without wearing protective clothing, such as closed shoes, long pants, and a long-sleeved shirt. What are the signs or symptoms? Symptoms of this condition include:  Redness and itching.  A rash that often includes bumps and blisters. The rash usually appears 48 hours after exposure.  Swelling. This may occur if the reaction is more severe. Symptoms usually last for 1-2 weeks. However, the first time you develop this condition, symptoms may last 3-4 weeks. How is this diagnosed? This condition may be diagnosed based on your symptoms and a physical exam. Your health care provider may also ask you about any recent outdoor activity. How is this treated? Treatment for this condition will vary depending on how severe it is. Treatment may include:  Hydrocortisone creams or calamine lotions to  relieve itching.  Oatmeal baths to soothe the skin.  Over-the-counter antihistamine tablets.  Oral steroid medicine for more severe outbreaks. Follow these instructions at home:  Take or apply over-the-counter and prescription medicines only as told by your health care provider.  Wash exposed skin as soon as possible with soap and cold water.  Use hydrocortisone creams or calamine lotion as needed to soothe the skin and relieve itching.  Take oatmeal baths as needed. Use colloidal oatmeal. You can get this at your local pharmacy or grocery store. Follow the instructions on the packaging.  Do not scratch or rub your skin.  While you have the rash, wash clothes right after you wear them. How is this prevented?  Learn to identify the poison ivy plant and avoid contact with the plant. This plant can be recognized by the number of leaves. Generally, poison ivy has three leaves with flowering branches on a single stem. The leaves are typically glossy, and they have jagged edges that come to a point at the front.  If you have been exposed to poison ivy, thoroughly wash with soap and water right away. You have about 30 minutes to remove the plant resin before it will cause the rash. Be sure to wash under your fingernails because any plant resin there will continue to spread the rash.  When hiking or camping, wear clothes that will help you to avoid exposure on the skin. This includes long pants, a long-sleeved shirt, tall socks, and hiking boots. You can also apply preventive lotion to your skin to help limit  exposure.  If you suspect that your clothes or outdoor gear came in contact with poison ivy, rinse them off outside with a garden hose before you bring them inside your house. Contact a health care provider if:  You have open sores in the rash area.  You have more redness, swelling, or pain in the affected area.  You have redness that spreads beyond the rash area.  You have fluid,  blood, or pus coming from the affected area.  You have a fever.  You have a rash over a large area of your body.  You have a rash on your eyes, mouth, or genitals.  Your rash does not improve after a few days. Get help right away if:  Your face swells or your eyes swell shut.  You have trouble breathing.  You have trouble swallowing. This information is not intended to replace advice given to you by your health care provider. Make sure you discuss any questions you have with your health care provider. Document Released: 10/12/2000 Document Revised: 03/22/2016 Document Reviewed: 03/23/2015 Elsevier Interactive Patient Education  2017 [place] you healthy  Get these tests  Blood pressure- Have your blood pressure checked once a year by your healthcare provider.  Normal blood pressure is 120/80  Weight- Have your body mass index (BMI) calculated to screen for obesity.  BMI is a measure of body fat based on height and weight. You can also calculate your own BMI at ViewBanking.si.  Cholesterol- Have your cholesterol checked every year.  Diabetes- Have your blood sugar checked regularly if you have high blood pressure, high cholesterol, have a family history of diabetes or if you are overweight.  Screening for Colon Cancer- Colonoscopy starting at age 50.  Screening may begin sooner depending on your family history and other health conditions. Follow up colonoscopy as directed by your Gastroenterologist.  Screening for Prostate Cancer- Both blood work (PSA) and a rectal exam help screen for Prostate Cancer.  Screening begins at age 1 with African-American men and at age 51 with Caucasian men.  Screening may begin sooner depending on your family history.  Take these medicines  Aspirin- One aspirin daily can help prevent Heart disease and Stroke.  Flu shot- Every fall.  Tetanus- Every 10 years.  Zostavax- Once after the age of 73 to prevent  Shingles.  Pneumonia shot- Once after the age of 47; if you are younger than 59, ask your healthcare provider if you need a Pneumonia shot.  Take these steps  Don't smoke- If you do smoke, talk to your doctor about quitting.  For tips on how to quit, go to www.smokefree.gov or call 1-800-QUIT-NOW.  Be physically active- Exercise 5 days a week for at least 30 minutes.  If you are not already physically active start slow and gradually work up to 30 minutes of moderate physical activity.  Examples of moderate activity include walking briskly, mowing the yard, dancing, swimming, bicycling, etc.  Eat a healthy diet- Eat a variety of healthy food such as fruits, vegetables, low fat milk, low fat cheese, yogurt, lean meant, poultry, fish, beans, tofu, etc. For more information go to www.thenutritionsource.org  Drink alcohol in moderation- Limit alcohol intake to less than two drinks a day. Never drink and drive.  Dentist- Brush and floss twice daily; visit your dentist twice a year.  Depression- Your emotional health is as important as your physical health. If you're feeling down, or losing interest in things you would  normally enjoy please talk to your healthcare provider.  Eye exam- Visit your eye doctor every year.  Safe sex- If you may be exposed to a sexually transmitted infection, use a condom.  Seat belts- Seat belts can save your life; always wear one.  Smoke/Carbon Monoxide detectors- These detectors need to be installed on the appropriate level of your home.  Replace batteries at least once a year.  Skin cancer- When out in the sun, cover up and use sunscreen 15 SPF or higher.  Violence- If anyone is threatening you, please tell your healthcare provider.  Living Will/ Health care power of attorney- Speak with your healthcare provider and family.   IF you received an x-ray today, you will receive an invoice from [facility]. Please contact [facility] Radiology at  [phone] with questions or concerns regarding your invoice.   IF you received labwork today, you will receive an invoice from [place]. Please contact [facility] at [phone] with questions or concerns regarding your invoice.   Our billing staff will not be able to assist you with questions regarding bills from these companies.  You will be contacted with the lab results as soon as they are available. The fastest way to get your results is to activate your My Chart account. Instructions are located on the last page of this paperwork. If you have not heard from [place] regarding the results in 2 weeks, please contact this office.

## 2017-03-08 LAB — LIPID PANEL
CHOLESTEROL TOTAL: 285 mg/dL — AB (ref 100–199)
Chol/HDL Ratio: 5 ratio (ref 0.0–5.0)
HDL: 57 mg/dL (ref >39)
LDL Calculated: 192 mg/dL — ABNORMAL HIGH (ref 0–99)
Triglycerides: 178 mg/dL — ABNORMAL HIGH (ref 0–149)
VLDL CHOLESTEROL CAL: 36 mg/dL (ref 5–40)

## 2017-03-08 LAB — COMPREHENSIVE METABOLIC PANEL
ALBUMIN: 4.2 g/dL (ref 3.6–4.8)
ALT: 30 IU/L (ref 0–44)
AST: 20 IU/L (ref 0–40)
Albumin/Globulin Ratio: 2 (ref 1.2–2.2)
Alkaline Phosphatase: 91 IU/L (ref 39–117)
BUN / CREAT RATIO: 19 (ref 10–24)
BUN: 19 mg/dL (ref 8–27)
Bilirubin Total: 1.1 mg/dL (ref 0.0–1.2)
CALCIUM: 9.2 mg/dL (ref 8.6–10.2)
CO2: 26 mmol/L (ref 18–29)
Chloride: 103 mmol/L (ref 96–106)
Creatinine, Ser: 0.99 mg/dL (ref 0.76–1.27)
GFR, EST AFRICAN AMERICAN: 95 mL/min/{1.73_m2} (ref >59)
GFR, EST NON AFRICAN AMERICAN: 82 mL/min/{1.73_m2} (ref >59)
GLOBULIN, TOTAL: 2.1 g/dL (ref 1.5–4.5)
Glucose: 84 mg/dL (ref 65–99)
Potassium: 4.3 mmol/L (ref 3.5–5.2)
SODIUM: 143 mmol/L (ref 134–144)
Total Protein: 6.3 g/dL (ref 6.0–8.5)

## 2017-03-08 LAB — HIV ANTIBODY (ROUTINE TESTING W REFLEX): HIV Screen 4th Generation wRfx: NONREACTIVE

## 2017-03-08 LAB — PSA: PROSTATE SPECIFIC AG, SERUM: 3.1 ng/mL (ref 0.0–4.0)

## 2017-03-08 LAB — CK: Total CK: 57 U/L (ref 24–204)

## 2017-03-29 NOTE — Addendum Note (Signed)
Addendum  created 03/29/17 1055 by Effie Berkshire, MD   Sign clinical note

## 2017-06-21 DIAGNOSIS — K080 Exfoliation of teeth due to systemic causes: Secondary | ICD-10-CM | POA: Diagnosis not present

## 2017-08-02 ENCOUNTER — Other Ambulatory Visit: Payer: Self-pay | Admitting: Family Medicine

## 2017-08-02 NOTE — Telephone Encounter (Signed)
Pt. Called in 08/02/17 for a chlosterol med refill  Please advise

## 2017-08-05 ENCOUNTER — Other Ambulatory Visit: Payer: Self-pay

## 2017-08-05 DIAGNOSIS — E785 Hyperlipidemia, unspecified: Secondary | ICD-10-CM

## 2017-08-05 MED ORDER — ATORVASTATIN CALCIUM 40 MG PO TABS: ORAL_TABLET | ORAL | 0 refills | Status: DC

## 2017-09-12 ENCOUNTER — Encounter: Payer: Self-pay | Admitting: Family Medicine

## 2017-09-12 ENCOUNTER — Other Ambulatory Visit: Payer: Self-pay

## 2017-09-12 ENCOUNTER — Ambulatory Visit: Payer: Federal, State, Local not specified - PPO | Admitting: Family Medicine

## 2017-09-12 VITALS — BP 126/74 | HR 62 | Temp 97.9°F | Resp 16 | Ht 72.24 in | Wt 228.4 lb

## 2017-09-12 DIAGNOSIS — Z23 Encounter for immunization: Secondary | ICD-10-CM | POA: Diagnosis not present

## 2017-09-12 DIAGNOSIS — R252 Cramp and spasm: Secondary | ICD-10-CM

## 2017-09-12 DIAGNOSIS — E785 Hyperlipidemia, unspecified: Secondary | ICD-10-CM | POA: Diagnosis not present

## 2017-09-12 DIAGNOSIS — R972 Elevated prostate specific antigen [PSA]: Secondary | ICD-10-CM

## 2017-09-12 NOTE — Progress Notes (Signed)
Subjective:  By signing my name below, I, Ralph Hayes, attest that this documentation has been prepared under the direction and in the presence of [name], MD Electronically Signed: Ladene Artist, ED Scribe 09/12/2017 at 8:13 AM.   Patient ID: Ralph Hayes, male    DOB: Sep 06, 1956, 61 y.o.   MRN: [id number]  Chief Complaint  Patient presents with  . Hyperlipidemia    6 month follow  . Arm Pain    patient having muscle spasms in R forearm, intermittant   HPI Ralph Hayes is a 61 y.o. male who presents to Primary Care at [facility] for follow-up. Seen in May for CPE. Pt is fasting at this visit.    Hyperlipidemia Lab Results  Component Value Date   CHOL 285 (H) 03/07/2017   HDL 57 03/07/2017   LDLCALC 192 (H) 03/07/2017   TRIG 178 (H) 03/07/2017   CHOLHDL 5.0 03/07/2017    Lab Results  Component Value Date   ALT 30 03/07/2017   AST 20 03/07/2017   ALKPHOS 91 03/07/2017   BILITOT 1.1 03/07/2017  He was restarted on Lipitor 40 mg qd at that time. Denies cp, sob, light-headedness.  Prostate CA Screening Lab Results  Component Value Date   PSA 2.52 03/08/2016   PSA 2.56 04/07/2015   PSA 2.63 09/21/2014  Had increased from 2.5 range over the past 2 years. Plan for recheck today. Denies urinary frequency, nocturia, difficulty urinating.  R Arm Pain Pt reports intermittent R forearm spasms, reporting 4-5 spasms within the past 6 months. States he often is unable to tell how much pressure he has on an object with gripping when he experiences spasms. Has not noticed that spasms are not associated with activities. Pt is L hand dominant.  Patient Active Problem List   Diagnosis Date Noted  . Cervical disc disorder with radiculopathy of cervical region 11/17/2014  . 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 Bradycardia 05/04/2014   Past Medical History:  Diagnosis Date  . Hyperlipidemia   . Medical history non-contributory    Past Surgical History:    Procedure Laterality Date  . HERNIA REPAIR    . NO PAST SURGERIES    . UMBILICAL HERNIA REPAIR N/A 12/28/2016   Procedure: LAPAROSCOPIC UMBILICAL HERNIA;  Surgeon: Alphonsa Overall, MD;  Location: [place];  Service: General;  Laterality: N/A;   Allergies  Allergen Reactions  . Penicillins Swelling    SWELLING REACTION UNSPECIFIED   Has patient had a PCN reaction causing immediate rash, facial/tongue/throat swelling, SOB or lightheadedness with hypotension:unsure Has patient had a PCN reaction causing severe rash involving mucus membranes or skin necrosis:unsure Has patient had a PCN reaction that required hospitalization:No Has patient had a PCN reaction occurring within the last 10 years:unsure If all of the above answers are "NO", then may proceed with Cephalosporin use. Childhood reaction   Prior to Admission medications   Medication Sig Start Date End Date Taking? Authorizing Provider  aspirin EC 81 MG tablet Take 81 mg by mouth every morning.   Yes [provider]  atorvastatin (LIPITOR) 40 MG tablet TAKE 1 TABLET BY MOUTH EVERY DAY. 08/05/17  Yes [name], MD  Coenzyme Q10 (COQ10 PO) Take 1 capsule by mouth 3 (three) times a week.   Yes [provider]  triamcinolone cream (KENALOG) 0.1 % Apply 1 application topically 2 (two) times daily. Patient not taking: Reported on 09/12/2017 03/07/17   [name], MD   Social History  Socioeconomic History  . Marital status: Married    Spouse name: Not on file  . Number of children: Not on file  . Years of education: Not on file  . Highest education level: Not on file  Social Needs  . Financial resource strain: Not on file  . Food insecurity - worry: Not on file  . Food insecurity - inability: Not on file  . Transportation needs - medical: Not on file  . Transportation needs - non-medical: Not on file  Occupational History  . Not on file  Tobacco Use  . Smoking status: Never Smoker  . Smokeless  tobacco: Never Used  Substance and Sexual Activity  . Alcohol use: Yes    Alcohol/week: 1.2 oz    Types: 2 Glasses of wine per week    Comment: twice a wk  . Drug use: No  . Sexual activity: Yes  Other Topics Concern  . Not on file  Social History Narrative   Married. Education: [employer].    Review of Systems  Constitutional: Negative for fatigue and unexpected weight change.  Eyes: Negative for visual disturbance.  Respiratory: Negative for cough, chest tightness and shortness of breath.   Cardiovascular: Negative for chest pain, palpitations and leg swelling.  Gastrointestinal: Negative for abdominal pain and blood in stool.  Genitourinary: Negative for difficulty urinating and frequency.  Musculoskeletal: Positive for myalgias.  Neurological: Negative for dizziness, light-headedness and headaches.      Objective:   Physical Exam  Constitutional: He is oriented to person, place, and time. He appears well-developed and well-nourished.  HENT:  Head: Normocephalic and atraumatic.  Eyes: EOM are normal. Pupils are equal, round, and reactive to light.  Neck: No JVD present. Carotid bruit is not present.  Cardiovascular: Normal rate, regular rhythm and normal heart sounds.  No murmur heard. Pulmonary/Chest: Effort normal and breath sounds normal. He has no rales.  Musculoskeletal: He exhibits no edema.  Equal strength UE including grip strength.  Neurological: He is alert and oriented to person, place, and time.  Reflex Scores:      Tricep reflexes are 2+ on the right side and 2+ on the left side.      Bicep reflexes are 2+ on the right side and 2+ on the left side.      Brachioradialis reflexes are 2+ on the right side and 2+ on the left side. Skin: Skin is warm and dry.  Psychiatric: He has a normal mood and affect.  Vitals reviewed.  Vitals:   09/12/17 0804  Pulse: 62  Resp: 16  Temp: 97.9 F (36.6 C)  TempSrc: Oral  SpO2: 99%  Weight: 228 lb 6.4 oz (103.6 kg)    Height: 6' 0.24" (1.835 m)      Assessment & Plan:  Ralph Hayes is a 61 y.o. male Hyperlipidemia, unspecified hyperlipidemia type - Plan: Lipid panel, Comprehensive metabolic panel  - Now back on Lipitor, overall tolerated. Check labs  Need for prophylactic vaccination and inoculation against influenza - Plan: Flu Vaccine QUAD 36+ mos IM  Rising PSA level - Plan: PSA  -Slight increase from prior baseline, asymptomatic. Repeat PSA to check trend  Cramp in limb  - Reassuring exam, normal strength, normal reflexes. Intermittent symptoms. May be a overuse type syndrome.   -If returns, recommended thinking through previous few days of activity to see if there is one associated activity that caused symptoms. RTC precautions if more persistent or if any weakness  No orders of the defined  types were placed in this encounter.  Patient Instructions    I will repeat cholesterol test, electrolytes as well as PSA test today. No change in medications for now.  Exam of your right arm is reassuring. If the symptoms return, try to think back to the previous day or two to see if there was a specific activity that caused the issue. If this become more frequent, or any weakness, return to discuss further.  IF you received an x-ray today, you will receive an invoice from [facility] Radiology. Please contact [facility] Radiology at [phone] with questions or concerns regarding your invoice.   IF you received labwork today, you will receive an invoice from [place]. Please contact [facility] at [phone] with questions or concerns regarding your invoice.   Our billing staff will not be able to assist you with questions regarding bills from these companies.  You will be contacted with the lab results as soon as they are available. The fastest way to get your results is to activate your My Chart account. Instructions are located on the last page of this paperwork. If you have not heard from [place]  regarding the results in 2 weeks, please contact this office.      I personally performed the services described in this documentation, which was scribed in my presence. The recorded information has been reviewed and considered for accuracy and completeness, addended by me as needed, and agree with information above.  Signed,   [name], MD Primary Care at [place].  09/12/17 8:22 AM

## 2017-09-12 NOTE — Patient Instructions (Addendum)
  I will repeat cholesterol test, electrolytes as well as PSA test today. No change in medications for now.  Exam of your right arm is reassuring. If the symptoms return, try to think back to the previous day or two to see if there was a specific activity that caused the issue. If this become more frequent, or any weakness, return to discuss further.  IF you received an x-ray today, you will receive an invoice from [facility] Radiology. Please contact [facility] Radiology at [phone] with questions or concerns regarding your invoice.   IF you received labwork today, you will receive an invoice from [place]. Please contact [facility] at [phone] with questions or concerns regarding your invoice.   Our billing staff will not be able to assist you with questions regarding bills from these companies.  You will be contacted with the lab results as soon as they are available. The fastest way to get your results is to activate your My Chart account. Instructions are located on the last page of this paperwork. If you have not heard from [place] regarding the results in 2 weeks, please contact this office.

## 2017-09-13 LAB — COMPREHENSIVE METABOLIC PANEL
A/G RATIO: 2.4 — AB (ref 1.2–2.2)
ALK PHOS: 104 IU/L (ref 39–117)
ALT: 59 IU/L — ABNORMAL HIGH (ref 0–44)
AST: 33 IU/L (ref 0–40)
Albumin: 4.5 g/dL (ref 3.6–4.8)
BILIRUBIN TOTAL: 1.1 mg/dL (ref 0.0–1.2)
BUN / CREAT RATIO: 16 (ref 10–24)
BUN: 13 mg/dL (ref 8–27)
CHLORIDE: 106 mmol/L (ref 96–106)
CO2: 22 mmol/L (ref 20–29)
Calcium: 9.2 mg/dL (ref 8.6–10.2)
Creatinine, Ser: 0.8 mg/dL (ref 0.76–1.27)
GFR calc Af Amer: 112 mL/min/{1.73_m2} (ref >59)
GFR calc non Af Amer: 97 mL/min/{1.73_m2} (ref >59)
GLOBULIN, TOTAL: 1.9 g/dL (ref 1.5–4.5)
Glucose: 106 mg/dL — ABNORMAL HIGH (ref 65–99)
Potassium: 4.3 mmol/L (ref 3.5–5.2)
Sodium: 146 mmol/L — ABNORMAL HIGH (ref 134–144)
Total Protein: 6.4 g/dL (ref 6.0–8.5)

## 2017-09-13 LAB — LIPID PANEL
CHOLESTEROL TOTAL: 176 mg/dL (ref 100–199)
Chol/HDL Ratio: 3.1 ratio (ref 0.0–5.0)
HDL: 56 mg/dL (ref >39)
LDL CALC: 99 mg/dL (ref 0–99)
Triglycerides: 106 mg/dL (ref 0–149)
VLDL CHOLESTEROL CAL: 21 mg/dL (ref 5–40)

## 2017-09-13 LAB — PSA: Prostate Specific Ag, Serum: 2.9 ng/mL (ref 0.0–4.0)

## 2017-09-23 ENCOUNTER — Other Ambulatory Visit: Payer: Self-pay | Admitting: Family Medicine

## 2017-09-23 DIAGNOSIS — R7989 Other specified abnormal findings of blood chemistry: Secondary | ICD-10-CM

## 2017-09-23 DIAGNOSIS — E870 Hyperosmolality and hypernatremia: Secondary | ICD-10-CM

## 2017-09-23 DIAGNOSIS — R945 Abnormal results of liver function studies: Secondary | ICD-10-CM

## 2017-10-11 ENCOUNTER — Ambulatory Visit: Payer: Federal, State, Local not specified - PPO | Admitting: Family Medicine

## 2017-10-11 ENCOUNTER — Encounter: Payer: Self-pay | Admitting: Family Medicine

## 2017-10-11 ENCOUNTER — Other Ambulatory Visit: Payer: Self-pay

## 2017-10-11 VITALS — BP 110/62 | HR 61 | Temp 98.6°F | Resp 18 | Ht 72.24 in | Wt 228.6 lb

## 2017-10-11 DIAGNOSIS — R945 Abnormal results of liver function studies: Secondary | ICD-10-CM | POA: Diagnosis not present

## 2017-10-11 DIAGNOSIS — R739 Hyperglycemia, unspecified: Secondary | ICD-10-CM

## 2017-10-11 DIAGNOSIS — R7989 Other specified abnormal findings of blood chemistry: Secondary | ICD-10-CM

## 2017-10-11 DIAGNOSIS — E870 Hyperosmolality and hypernatremia: Secondary | ICD-10-CM | POA: Diagnosis not present

## 2017-10-11 DIAGNOSIS — M25562 Pain in left knee: Secondary | ICD-10-CM | POA: Diagnosis not present

## 2017-10-11 NOTE — Patient Instructions (Addendum)
I will check your liver test, sodium and blood sugar as well today.   Knee pain likely due to arthritis.  Can try tylenol if needed for pain, Glucosamine over the counter (ok to stop if no improvement in next 6 weeks). If not improving into January - return for xrays and possible brace. Sooner if worse. Thanks for coming in today.     IF you received an x-ray today, you will receive an invoice from [facility] Radiology. Please contact [facility] Radiology at [phone] with questions or concerns regarding your invoice.   IF you received labwork today, you will receive an invoice from [place]. Please contact [facility] at [phone] with questions or concerns regarding your invoice.   Our billing staff will not be able to assist you with questions regarding bills from these companies.  You will be contacted with the lab results as soon as they are available. The fastest way to get your results is to activate your My Chart account. Instructions are located on the last page of this paperwork. If you have not heard from [place] regarding the results in 2 weeks, please contact this office.

## 2017-10-11 NOTE — Progress Notes (Signed)
Subjective:  By signing my name below, I, [name], attest that this documentation has been prepared under the direction and in the presence of [name], MD Electronically Signed: Ladene Artist, ED Scribe 10/11/2017 at 4:03 PM.   Patient ID: Ralph Hayes, male    DOB: 1956/09/11, 61 y.o.   MRN: [id number]  Chief Complaint  Patient presents with  . Knee Pain    left knee xcouple weeks   . bloodwork    lipid elevation discussion    HPI Ralph Hayes is a 61 y.o. male who presents to Primary Care at [facility] for L knee pain and lab work.   L Knee Pain Pt reports mild L knee pain x 1 month that began to improve 2 weeks ago but has now worsened. He has been doing yoga on his knees and does recall tripping over a laundry basket 2-3 weeks ago but states he was able to ambulate without difficulty afterwards. However, he has noticed that he has been limping over the past few days. Reports increased pain with walking up stairs. Pt has not tried heat, ice, compression, medications. Denies weakness, joint swelling.  Hyperlipidemia Lab Results  Component Value Date   CHOL 176 09/12/2017   HDL 56 09/12/2017   LDLCALC 99 09/12/2017   TRIG 106 09/12/2017   CHOLHDL 3.1 09/12/2017  Lipitor 40 mg qd. We discussed this 11/15. He restarted Lipitor in May as LDL 192 at that time.  Elevated LFTs Lab Results  Component Value Date   ALT 59 (H) 09/12/2017   AST 33 09/12/2017   ALKPHOS 104 09/12/2017   BILITOT 1.1 09/12/2017  Previously normal 7 months ago. Also borderline sodium and borderline glucose at lab visit 11/15. Repeat CMP was drawn today. Pt was fasting. Denies abdominal pain, nausea, vomiting. Pt reports having a glass of wine every other night.  Patient Active Problem List   Diagnosis Date Noted  . Cervical disc disorder with radiculopathy of cervical region 11/17/2014  . 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 Bradycardia 05/04/2014   Past Medical History:    Diagnosis Date  . Hyperlipidemia   . Medical history non-contributory    Past Surgical History:  Procedure Laterality Date  . HERNIA REPAIR    . NO PAST SURGERIES    . UMBILICAL HERNIA REPAIR N/A 12/28/2016   Procedure: LAPAROSCOPIC UMBILICAL HERNIA;  Surgeon: Alphonsa Overall, MD;  Location: [place];  Service: General;  Laterality: N/A;   Allergies  Allergen Reactions  . Penicillins Swelling    SWELLING REACTION UNSPECIFIED   Has patient had a PCN reaction causing immediate rash, facial/tongue/throat swelling, SOB or lightheadedness with hypotension:unsure Has patient had a PCN reaction causing severe rash involving mucus membranes or skin necrosis:unsure Has patient had a PCN reaction that required hospitalization:No Has patient had a PCN reaction occurring within the last 10 years:unsure If all of the above answers are "NO", then may proceed with Cephalosporin use. Childhood reaction   Prior to Admission medications   Medication Sig Start Date End Date Taking? Authorizing Provider  aspirin EC 81 MG tablet Take 81 mg by mouth every morning.    [provider]  atorvastatin (LIPITOR) 40 MG tablet TAKE 1 TABLET BY MOUTH EVERY DAY. 08/05/17   [name], MD  Coenzyme Q10 (COQ10 PO) Take 1 capsule by mouth 3 (three) times a week.    [provider]  triamcinolone cream (KENALOG) 0.1 % Apply 1 application topically 2 (two) times daily.  Patient not taking: Reported on 09/12/2017 03/07/17   [name], MD   Social History   Socioeconomic History  . Marital status: Married    Spouse name: Not on file  . Number of children: Not on file  . Years of education: Not on file  . Highest education level: Not on file  Social Needs  . Financial resource strain: Not on file  . Food insecurity - worry: Not on file  . Food insecurity - inability: Not on file  . Transportation needs - medical: Not on file  . Transportation needs - non-medical: Not on file   Occupational History  . Not on file  Tobacco Use  . Smoking status: Never Smoker  . Smokeless tobacco: Never Used  Substance and Sexual Activity  . Alcohol use: Yes    Alcohol/week: 1.2 oz    Types: 2 Glasses of wine per week    Comment: twice a wk  . Drug use: No  . Sexual activity: Yes  Other Topics Concern  . Not on file  Social History Narrative   Married. Education: [employer].    Review of Systems  Gastrointestinal: Negative for abdominal pain, nausea and vomiting.  Musculoskeletal: Positive for arthralgias. Negative for joint swelling.  Neurological: Negative for weakness.      Objective:   Physical Exam  Constitutional: He is oriented to person, place, and time. He appears well-developed and well-nourished. No distress.  HENT:  Head: Normocephalic and atraumatic.  Eyes: Conjunctivae and EOM are normal.  Neck: Neck supple. No tracheal deviation present.  Cardiovascular: Normal rate and regular rhythm.  Pulmonary/Chest: Effort normal and breath sounds normal. No respiratory distress.  Musculoskeletal: Normal range of motion.  L knee: Tender on the distal femur, medial joint line on L knee. Otherwise no bony tenderness. No effusion. Skin intact. No rash. Full ROM.   Neurological: He is alert and oriented to person, place, and time.  Skin: Skin is warm and dry.  Psychiatric: He has a normal mood and affect. His behavior is normal.  Nursing note and vitals reviewed.  Vitals:   10/11/17 1519  BP: 110/62  Pulse: 61  Resp: 18  Temp: 98.6 F (37 C)  TempSrc: Oral  SpO2: 96%  Weight: 228 lb 9.6 oz (103.7 kg)  Height: 6' 0.24" (1.835 m)      Assessment & Plan:  Ralph Hayes is a 61 y.o. male Hyperglycemia - Plan: Comprehensive metabolic panel Hypernatremia - Plan: Comprehensive metabolic panel LFT elevation - Plan: Comprehensive metabolic panel   - repeat labwork for borderline/mild prior abnormalities.   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 Offered XR or unloader brace, but declined at present.   - plans to try tylenol prn, glucosamine, and RTC precautions if not improving. Sooner if worse or instability sx' s.   No orders of the defined types were placed in this encounter.  Patient Instructions   I will check your liver test, sodium and blood sugar as well today.   Knee pain likely due to arthritis.  Can try tylenol if needed for pain, Glucosamine over the counter (ok to stop if no improvement in next 6 weeks). If not improving into January - return for xrays and possible brace. Sooner if worse. Thanks for coming in today.     IF you received an x-ray today, you will receive an invoice from [facility]. Please contact [facility] at [phone] with questions or concerns  regarding your invoice.   IF you received labwork today, you will receive an invoice from [facility]. Please contact [facility] at [phone] with questions or concerns regarding your invoice.   Our billing staff will not be able to assist you with questions regarding bills from these companies.  You will be contacted with the lab results as soon as they are available. The fastest way to get your results is to activate your My Chart account. Instructions are located on the last page of this paperwork. If you have not heard from [place] regarding the results in 2 weeks, please contact this office.      I personally performed the services described in this documentation, which was scribed in my presence. The recorded information has been reviewed and considered for accuracy and completeness, addended by me as needed, and agree with information above.  Signed,   [name], MD Primary Care at [place].  10/12/17 10:11 PM

## 2017-10-12 LAB — COMPREHENSIVE METABOLIC PANEL
ALK PHOS: 91 IU/L (ref 39–117)
ALT: 34 IU/L (ref 0–44)
AST: 20 IU/L (ref 0–40)
Albumin/Globulin Ratio: 2.3 — ABNORMAL HIGH (ref 1.2–2.2)
Albumin: 4.2 g/dL (ref 3.6–4.8)
BILIRUBIN TOTAL: 1.1 mg/dL (ref 0.0–1.2)
BUN/Creatinine Ratio: 24 (ref 10–24)
BUN: 22 mg/dL (ref 8–27)
CO2: 27 mmol/L (ref 20–29)
CREATININE: 0.92 mg/dL (ref 0.76–1.27)
Calcium: 8.9 mg/dL (ref 8.6–10.2)
Chloride: 106 mmol/L (ref 96–106)
GFR calc Af Amer: 104 mL/min/{1.73_m2} (ref >59)
GFR calc non Af Amer: 90 mL/min/{1.73_m2} (ref >59)
GLUCOSE: 106 mg/dL — AB (ref 65–99)
Globulin, Total: 1.8 g/dL (ref 1.5–4.5)
Potassium: 4.3 mmol/L (ref 3.5–5.2)
SODIUM: 144 mmol/L (ref 134–144)
Total Protein: 6 g/dL (ref 6.0–8.5)

## 2018-02-20 DIAGNOSIS — K080 Exfoliation of teeth due to systemic causes: Secondary | ICD-10-CM | POA: Diagnosis not present

## 2018-02-21 ENCOUNTER — Telehealth: Payer: Self-pay | Admitting: Family Medicine

## 2018-02-21 NOTE — Telephone Encounter (Signed)
Called pt to inform him that his last CPE was 03/07/17 and his CPE is scheduled 02/27/18. Less than a year. Explained that MOST insurances won't cover a CPE until 1 year + 1 day. Advised pt to call his insurance and then call us back and reschedule if needed.   Left VM

## 2018-02-27 ENCOUNTER — Ambulatory Visit (INDEPENDENT_AMBULATORY_CARE_PROVIDER_SITE_OTHER): Payer: Federal, State, Local not specified - PPO | Admitting: Family Medicine

## 2018-02-27 ENCOUNTER — Encounter: Payer: Self-pay | Admitting: Family Medicine

## 2018-02-27 VITALS — BP 126/78 | HR 98 | Temp 97.7°F | Ht 73.0 in | Wt 230.2 lb

## 2018-02-27 DIAGNOSIS — E785 Hyperlipidemia, unspecified: Secondary | ICD-10-CM | POA: Diagnosis not present

## 2018-02-27 DIAGNOSIS — Z0000 Encounter for general adult medical examination without abnormal findings: Secondary | ICD-10-CM

## 2018-02-27 DIAGNOSIS — R739 Hyperglycemia, unspecified: Secondary | ICD-10-CM | POA: Diagnosis not present

## 2018-02-27 DIAGNOSIS — Z23 Encounter for immunization: Secondary | ICD-10-CM | POA: Diagnosis not present

## 2018-02-27 LAB — COMPREHENSIVE METABOLIC PANEL
A/G RATIO: 2.3 — AB (ref 1.2–2.2)
ALT: 29 IU/L (ref 0–44)
AST: 16 IU/L (ref 0–40)
Albumin: 4.3 g/dL (ref 3.6–4.8)
Alkaline Phosphatase: 86 IU/L (ref 39–117)
BUN/Creatinine Ratio: 14 (ref 10–24)
BUN: 14 mg/dL (ref 8–27)
Bilirubin Total: 1.2 mg/dL (ref 0.0–1.2)
CALCIUM: 9.3 mg/dL (ref 8.6–10.2)
CO2: 24 mmol/L (ref 20–29)
Chloride: 106 mmol/L (ref 96–106)
Creatinine, Ser: 1.01 mg/dL (ref 0.76–1.27)
GFR, EST AFRICAN AMERICAN: 92 mL/min/{1.73_m2} (ref >59)
GFR, EST NON AFRICAN AMERICAN: 80 mL/min/{1.73_m2} (ref >59)
Globulin, Total: 1.9 g/dL (ref 1.5–4.5)
Glucose: 102 mg/dL — ABNORMAL HIGH (ref 65–99)
Potassium: 4.3 mmol/L (ref 3.5–5.2)
Sodium: 143 mmol/L (ref 134–144)
TOTAL PROTEIN: 6.2 g/dL (ref 6.0–8.5)

## 2018-02-27 LAB — LIPID PANEL
CHOL/HDL RATIO: 3.6 ratio (ref 0.0–5.0)
Cholesterol, Total: 191 mg/dL (ref 100–199)
HDL: 53 mg/dL (ref >39)
LDL CALC: 107 mg/dL — AB (ref 0–99)
TRIGLYCERIDES: 154 mg/dL — AB (ref 0–149)
VLDL CHOLESTEROL CAL: 31 mg/dL (ref 5–40)

## 2018-02-27 LAB — HEMOGLOBIN A1C
Est. average glucose Bld gHb Est-mCnc: 105 mg/dL
HEMOGLOBIN A1C: 5.3 % (ref 4.8–5.6)

## 2018-02-27 MED ORDER — ATORVASTATIN CALCIUM 40 MG PO TABS: ORAL_TABLET | ORAL | 2 refills | Status: DC

## 2018-02-27 NOTE — Progress Notes (Signed)
Subjective:  By signing my name below, I, Ralph Hayes, attest that this documentation has been prepared under the direction and in the presence of [name], MD Electronically Signed: Ladene Artist, ED Scribe 02/27/2018 at 8:15 AM.   Patient ID: Ralph Hayes, male    DOB: 1956/02/09, 62 y.o.   MRN: [id number]  Chief Complaint  Patient presents with  . Annual Exam    CPE   HPI Ralph Hayes is a 62 y.o. male who presents to Primary Care at [facility] for an annual exam. H/o hyperlipidemia, hyperglycemia. Pt is fasting at this visit.  Hyperlipidemia Lab Results  Component Value Date   CHOL 176 09/12/2017   HDL 56 09/12/2017   LDLCALC 99 09/12/2017   TRIG 106 09/12/2017   CHOLHDL 3.1 09/12/2017   Lab Results  Component Value Date   ALT 34 10/11/2017   AST 20 10/11/2017   ALKPHOS 91 10/11/2017   BILITOT 1.1 10/11/2017  COQ10 3 times/wk and Lipitor 40 mg qd. Significant improvement from LDL of 192 previously. - Denies new myalgias or any other symptoms.   Hyperglycemia  Borderline glucose 106 in past few blood draws.  CA Screening Colonoscopy: 01/27/2008 by Dr Henrene Pastor; pt plans to schedule Prostate CA Screening: PSA of 2.9 on 09/12/17, stable from 3.1 prior Lab Results  Component Value Date   PSA 2.52 03/08/2016   PSA 2.56 04/07/2015   PSA 2.63 09/21/2014   Immunizations Immunization History  Administered Date(s) Administered  . Influenza Split 08/05/2012  . Influenza,inj,Quad PF,6+ Mos 09/21/2014, 10/08/2016, 09/12/2017  . Tdap 11/25/2007  . Varicella 11/25/2007  . Zoster 05/06/2014  Tetanus: updated today Shingrix: sent to pharmacy  Depression Screening Depression screen [facility] 2/9 02/27/2018 10/11/2017 09/12/2017 03/07/2017 10/08/2016  Decreased Interest 0 0 0 0 0  Down, Depressed, Hopeless 0 0 0 0 0  PHQ - 2 Score 0 0 0 0 0     Visual Acuity Screening   Right eye Left eye Both eyes  Without correction: 20/25-1 20/50 20/30  With correction:       Vision: seen regularly by Dr. Sherryll Burger; told to f/u in a few yrs Dentist: seen regularly; last seen last wk Exercise: yoga, ping-pong, tai chi, dancing, gardening  Patient Active Problem List   Diagnosis Date Noted  . Cervical disc disorder with radiculopathy of cervical region 11/17/2014  . 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 Bradycardia 05/04/2014   Past Medical History:  Diagnosis Date  . Hyperlipidemia   . Medical history non-contributory    Past Surgical History:  Procedure Laterality Date  . HERNIA REPAIR    . NO PAST SURGERIES    . UMBILICAL HERNIA REPAIR N/A 12/28/2016   Procedure: LAPAROSCOPIC UMBILICAL HERNIA;  Surgeon: Alphonsa Overall, MD;  Location: [place];  Service: General;  Laterality: N/A;   Allergies  Allergen Reactions  . Penicillins Swelling    SWELLING REACTION UNSPECIFIED   Has patient had a PCN reaction causing immediate rash, facial/tongue/throat swelling, SOB or lightheadedness with hypotension:unsure Has patient had a PCN reaction causing severe rash involving mucus membranes or skin necrosis:unsure Has patient had a PCN reaction that required hospitalization:No Has patient had a PCN reaction occurring within the last 10 years:unsure If all of the above answers are "NO", then may proceed with Cephalosporin use. Childhood reaction   Prior to Admission medications   Medication Sig Start Date End Date Taking? Authorizing Provider  aspirin EC 81 MG tablet Take 81 mg by mouth every morning.  Yes [provider]  atorvastatin (LIPITOR) 40 MG tablet TAKE 1 TABLET BY MOUTH EVERY DAY. 08/05/17  Yes [name], MD  Coenzyme Q10 (COQ10 PO) Take 1 capsule by mouth 3 (three) times a week.   Yes [provider]   Social History   Socioeconomic History  . Marital status: Married    Spouse name: Not on file  . Number of children: Not on file  . Years of education: Not on file  . Highest education level: Not on file   Occupational History  . Not on file  Social Needs  . Financial resource strain: Not on file  . Food insecurity:    Worry: Not on file    Inability: Not on file  . Transportation needs:    Medical: Not on file    Non-medical: Not on file  Tobacco Use  . Smoking status: Never Smoker  . Smokeless tobacco: Never Used  Substance and Sexual Activity  . Alcohol use: Yes    Alcohol/week: 1.2 oz    Types: 2 Glasses of wine per week    Comment: twice a wk  . Drug use: No  . Sexual activity: Yes  Lifestyle  . Physical activity:    Days per week: Not on file    Minutes per session: Not on file  . Stress: Not on file  Relationships  . Social connections:    Talks on phone: Not on file    Gets together: Not on file    Attends religious service: Not on file    Active member of club or organization: Not on file    Attends meetings of clubs or organizations: Not on file    Relationship status: Not on file  . Intimate partner violence:    Fear of current or ex partner: Not on file    Emotionally abused: Not on file    Physically abused: Not on file    Forced sexual activity: Not on file  Other Topics Concern  . Not on file  Social History Narrative   Married. Education: [employer].    Review of Systems    Objective:   Physical Exam  Constitutional: He is oriented to person, place, and time. He appears well-developed and well-nourished.  HENT:  Head: Normocephalic and atraumatic.  Right Ear: External ear normal.  Left Ear: External ear normal.  Mouth/Throat: Oropharynx is clear and moist.  Eyes: Pupils are equal, round, and reactive to light. Conjunctivae and EOM are normal.  Neck: Normal range of motion. Neck supple. No thyromegaly present.  Cardiovascular: Normal rate, regular rhythm, normal heart sounds and intact distal pulses.  Pulmonary/Chest: Effort normal and breath sounds normal. No respiratory distress. He has no wheezes.  Abdominal: Soft. He exhibits no distension.  There is no tenderness.  Musculoskeletal: Normal range of motion. He exhibits no edema or tenderness.  Lymphadenopathy:    He has no cervical adenopathy.  Neurological: He is alert and oriented to person, place, and time. He has normal reflexes.  Skin: Skin is warm and dry.  Psychiatric: He has a normal mood and affect. His behavior is normal.  Vitals reviewed.  DRE deferred as prostate testing last visit.   Vitals:   02/27/18 0802  BP: 126/78  Pulse: 98  Temp: 97.7 F (36.5 C)  TempSrc: Oral  SpO2: 97%  Weight: 230 lb 3.2 oz (104.4 kg)  Height: '6\' 1"'  (1.854 m)      Assessment & Plan:   Ralph Hayes  is a 62 y.o. male Annual physical exam  - -anticipatory guidance as below in AVS, screening labs above. Health maintenance items as above in HPI discussed/recommended as applicable.   -Recommended calling to schedule colonoscopy as he appears to be due, and shingrix vaccine at his pharmacy.  Tdap given today.  Hyperlipidemia, unspecified hyperlipidemia type - Plan: Comprehensive metabolic panel, Lipid panel, atorvastatin (LIPITOR) 40 MG tablet  -Tolerating Lipitor, continue same dose, labs pending.  Hyperglycemia - Plan: Comprehensive metabolic panel, Hemoglobin A1c  -Mild elevation previously, screen for prediabetes/diabetes with A1c.  Recheck 6 months  Meds ordered this encounter  Medications  . atorvastatin (LIPITOR) 40 MG tablet    Sig: TAKE 1 TABLET BY MOUTH EVERY DAY.    Dispense:  90 tablet    Refill:  2   Patient Instructions   Call Dr. Blanch Media office to schedule colonoscopy: [facility]: Irene Shipper MD  Address: [address], [place], [place] [address]  Phone: [phone]  Tetanus updated today.   No change in meds for now. I will check labwork.   Continue routine follow up as planned with eye care provider.   Thanks for coming in today.   Keeping you healthy  Get these tests  Blood pressure- Have your blood pressure checked once a  year by your healthcare provider.  Normal blood pressure is 120/80  Weight- Have your body mass index (BMI) calculated to screen for obesity.  BMI is a measure of body fat based on height and weight. You can also calculate your own BMI at ViewBanking.[email or web address].  Cholesterol- Have your cholesterol checked every year.  Diabetes- Have your blood sugar checked regularly if you have high blood pressure, high cholesterol, have a family history of diabetes or if you are overweight.  Screening for Colon Cancer- Colonoscopy starting at age 33.  Screening may begin sooner depending on your family history and other health conditions. Follow up colonoscopy as directed by your Gastroenterologist.  Screening for Prostate Cancer- Both blood work (PSA) and a rectal exam help screen for Prostate Cancer.  Screening begins at age 21 with African-American men and at age 49 with Caucasian men.  Screening may begin sooner depending on your family history.  Take these medicines  Aspirin- One aspirin daily can help prevent Heart disease and Stroke.  Flu shot- Every fall.  Tetanus- Every 10 years.  Zostavax- Once after the age of 79 to prevent Shingles.  Pneumonia shot- Once after the age of 53; if you are younger than 15, ask your healthcare provider if you need a Pneumonia shot.  Take these steps  Don't smoke- If you do smoke, talk to your doctor about quitting.  For tips on how to quit, go to www.smokefree.gov or call 1-800-QUIT-NOW.  Be physically active- Exercise 5 days a week for at least 30 minutes.  If you are not already physically active start slow and gradually work up to 30 minutes of moderate physical activity.  Examples of moderate activity include walking briskly, mowing the yard, dancing, swimming, bicycling, etc.  Eat a healthy diet- Eat a variety of healthy food such as fruits, vegetables, low fat milk, low fat cheese, yogurt, lean meant, poultry, fish, beans, tofu, etc. For more  information go to www.thenutritionsource.org  Drink alcohol in moderation- Limit alcohol intake to less than two drinks a day. Never drink and drive.  Dentist- Brush and floss twice daily; visit your dentist twice a year.  Depression- Your emotional health is as important  as your physical health. If you're feeling down, or losing interest in things you would normally enjoy please talk to your healthcare provider.  Eye exam- Visit your eye doctor every year.  Safe sex- If you may be exposed to a sexually transmitted infection, use a condom.  Seat belts- Seat belts can save your life; always wear one.  Smoke/Carbon Monoxide detectors- These detectors need to be installed on the appropriate level of your home.  Replace batteries at least once a year.  Skin cancer- When out in the sun, cover up and use sunscreen 15 SPF or higher.  Violence- If anyone is threatening you, please tell your healthcare provider.  Living Will/ Health care power of attorney- Speak with your healthcare provider and family.  IF you received an x-ray today, you will receive an invoice from [facility]. Please contact [facility] at [phone] with questions or concerns regarding your invoice.   IF you received labwork today, you will receive an invoice from [place]. Please contact [facility] at [phone] with questions or concerns regarding your invoice.   Our billing staff will not be able to assist you with questions regarding bills from these companies.  You will be contacted with the lab results as soon as they are available. The fastest way to get your results is to activate your My Chart account. Instructions are located on the last page of this paperwork. If you have not heard from [place] regarding the results in 2 weeks, please contact this office.       I personally performed the services described in this documentation, which was scribed in my presence. The recorded information has  been reviewed and considered for accuracy and completeness, addended by me as needed, and agree with information above.  Signed,   [name], MD Primary Care at [place].  02/27/18 8:32 AM

## 2018-02-27 NOTE — Patient Instructions (Addendum)
Call Dr. Blanch Media office to schedule colonoscopy: [facility]: Irene Shipper MD  Address: [address], [place], [place] [address]  Phone: [phone]  Tetanus updated today.   No change in meds for now. I will check labwork.   Continue routine follow up as planned with eye care provider.   Thanks for coming in today.   Keeping you healthy  Get these tests  Blood pressure- Have your blood pressure checked once a year by your healthcare provider.  Normal blood pressure is 120/80  Weight- Have your body mass index (BMI) calculated to screen for obesity.  BMI is a measure of body fat based on height and weight. You can also calculate your own BMI at [employer]Banking[employer]si.  Cholesterol- Have your cholesterol checked every year.  Diabetes- Have your blood sugar checked regularly if you have high blood pressure, high cholesterol, have a family history of diabetes or if you are overweight.  Screening for Colon Cancer- Colonoscopy starting at age 15.  Screening may begin sooner depending on your family history and other health conditions. Follow up colonoscopy as directed by your Gastroenterologist.  Screening for Prostate Cancer- Both blood work (PSA) and a rectal exam help screen for Prostate Cancer.  Screening begins at age 69 with African-American men and at age 47 with Caucasian men.  Screening may begin sooner depending on your family history.  Take these medicines  Aspirin- One aspirin daily can help prevent Heart disease and Stroke.  Flu shot- Every fall.  Tetanus- Every 10 years.  Zostavax- Once after the age of 30 to prevent Shingles.  Pneumonia shot- Once after the age of 36; if you are younger than 53, ask your healthcare provider if you need a Pneumonia shot.  Take these steps  Don't smoke- If you do smoke, talk to your doctor about quitting.  For tips on how to quit, go to www.smokefree.gov or call 1-800-QUIT-NOW.  Be physically active- Exercise 5 days  a week for at least 30 minutes.  If you are not already physically active start slow and gradually work up to 30 minutes of moderate physical activity.  Examples of moderate activity include walking briskly, mowing the yard, dancing, swimming, bicycling, etc.  Eat a healthy diet- Eat a variety of healthy food such as fruits, vegetables, low fat milk, low fat cheese, yogurt, lean meant, poultry, fish, beans, tofu, etc. For more information go to www.thenutritionsource.org  Drink alcohol in moderation- Limit alcohol intake to less than two drinks a day. Never drink and drive.  Dentist- Brush and floss twice daily; visit your dentist twice a year.  Depression- Your emotional health is as important as your physical health. If you're feeling down, or losing interest in things you would normally enjoy please talk to your healthcare provider.  Eye exam- Visit your eye doctor every year.  Safe sex- If you may be exposed to a sexually transmitted infection, use a condom.  Seat belts- Seat belts can save your life; always wear one.  Smoke/Carbon Monoxide detectors- These detectors need to be installed on the appropriate level of your home.  Replace batteries at least once a year.  Skin cancer- When out in the sun, cover up and use sunscreen 15 SPF or higher.  Violence- If anyone is threatening you, please tell your healthcare provider.  Living Will/ Health care power of attorney- Speak with your healthcare provider and family.  IF you received an x-ray today, you will receive an invoice from [facility].  Please contact [facility] at [phone] with questions or concerns regarding your invoice.   IF you received labwork today, you will receive an invoice from [place] [facility]. Please contact [facility] at [phone] with questions or concerns regarding your invoice.   Our billing staff will not be able to assist you with questions regarding bills from these companies.  You will be  contacted with the lab results as soon as they are available. The fastest way to get your results is to activate your My Chart account. Instructions are located on the last page of this paperwork. If you have not heard from [place] regarding the results in 2 weeks, please contact this office.

## 2018-03-03 ENCOUNTER — Encounter: Payer: Self-pay | Admitting: Internal Medicine

## 2018-05-05 ENCOUNTER — Other Ambulatory Visit: Payer: Self-pay

## 2018-05-05 ENCOUNTER — Ambulatory Visit (AMBULATORY_SURGERY_CENTER): Payer: Self-pay

## 2018-05-05 VITALS — Ht 72.0 in | Wt 230.8 lb

## 2018-05-05 DIAGNOSIS — Z1211 Encounter for screening for malignant neoplasm of colon: Secondary | ICD-10-CM

## 2018-05-05 MED ORDER — NA SULFATE-K SULFATE-MG SULF 17.5-3.13-1.6 GM/177ML PO SOLN: 1.0000 | Freq: Once | ORAL | 0 refills | Status: AC

## 2018-05-05 NOTE — Progress Notes (Signed)
Denies allergies to eggs or soy products. Denies complication of anesthesia or sedation. Denies use of weight loss medication. Denies use of O2.   Emmi instructions declined.  

## 2018-05-12 ENCOUNTER — Encounter: Payer: Self-pay | Admitting: Internal Medicine

## 2018-05-12 ENCOUNTER — Other Ambulatory Visit: Payer: Self-pay

## 2018-05-12 ENCOUNTER — Ambulatory Visit (AMBULATORY_SURGERY_CENTER): Payer: Federal, State, Local not specified - PPO | Admitting: Internal Medicine

## 2018-05-12 VITALS — BP 106/66 | HR 47 | Temp 98.4°F | Resp 15 | Ht 72.0 in | Wt 230.0 lb

## 2018-05-12 DIAGNOSIS — D120 Benign neoplasm of cecum: Secondary | ICD-10-CM

## 2018-05-12 DIAGNOSIS — Z1211 Encounter for screening for malignant neoplasm of colon: Secondary | ICD-10-CM

## 2018-05-12 MED ORDER — SODIUM CHLORIDE 0.9 % IV SOLN: 500.0000 mL | INTRAVENOUS | Status: DC

## 2018-05-12 NOTE — Patient Instructions (Signed)
Handouts given:  Polyps   YOU HAD AN ENDOSCOPIC PROCEDURE TODAY AT THE [facility]:   Refer to the procedure report that was given to you for any specific questions about what was found during the examination.  If the procedure report does not answer your questions, please call your gastroenterologist to clarify.  If you requested that your care partner not be given the details of your procedure findings, then the procedure report has been included in a sealed envelope for you to review at your convenience later.  YOU SHOULD EXPECT: Some feelings of bloating in the abdomen. Passage of more gas than usual.  Walking can help get rid of the air that was put into your GI tract during the procedure and reduce the bloating. If you had a lower endoscopy (such as a colonoscopy or flexible sigmoidoscopy) you may notice spotting of blood in your stool or on the toilet paper. If you underwent a bowel prep for your procedure, you may not have a normal bowel movement for a few days.  Please Note:  You might notice some irritation and congestion in your nose or some drainage.  This is from the oxygen used during your procedure.  There is no need for concern and it should clear up in a day or so.  SYMPTOMS TO REPORT IMMEDIATELY:   Following lower endoscopy (colonoscopy or flexible sigmoidoscopy):  Excessive amounts of blood in the stool  Significant tenderness or worsening of abdominal pains  Swelling of the abdomen that is new, acute  Fever of 100F or higher   For urgent or emergent issues, a gastroenterologist can be reached at any hour by calling [phone].   DIET:  We do recommend a small meal at first, but then you may proceed to your regular diet.  Drink plenty of fluids but you should avoid alcoholic beverages for 24 hours.  ACTIVITY:  You should plan to take it easy for the rest of today and you should NOT DRIVE or use heavy machinery until tomorrow (because of the sedation  medicines used during the test).    FOLLOW UP: Our staff will call the number listed on your records the next business day following your procedure to check on you and address any questions or concerns that you may have regarding the information given to you following your procedure. If we do not reach you, we will leave a message.  However, if you are feeling well and you are not experiencing any problems, there is no need to return our call.  We will assume that you have returned to your regular daily activities without incident.  If any biopsies were taken you will be contacted by phone or by letter within the next 1-3 weeks.  Please call us at [phone] if you have not heard about the biopsies in 3 weeks.    SIGNATURES/CONFIDENTIALITY: You and/or your care partner have signed paperwork which will be entered into your electronic medical record.  These signatures attest to the fact that that the information above on your After Visit Summary has been reviewed and is understood.  Full responsibility of the confidentiality of this discharge information lies with you and/or your care-partner. 

## 2018-05-12 NOTE — Progress Notes (Signed)
Called to room to assist during endoscopic procedure.  Patient ID and intended procedure confirmed with present staff. Received instructions for my participation in the procedure from the performing physician.  

## 2018-05-12 NOTE — Progress Notes (Signed)
A and O x3. Report to RN. Tolerated MAC anesthesia well.

## 2018-05-12 NOTE — Progress Notes (Signed)
Pt's states no medical or surgical changes since previsit or office visit. 

## 2018-05-12 NOTE — Op Note (Signed)
[facility] Patient Name: Ralph Hayes Procedure Date: 05/12/2018 8:03 AM MRN: [id number] Endoscopist: Docia Chuck. Henrene Pastor , MD Age: 62 Referring MD:  Date of Birth: 29-Oct-1956 Gender: Male Account #: [id number] Procedure:                Colonoscopy, with cold snare polypectomy x 1 Indications:              Screening for colorectal malignant neoplasm.                            Negative index exam 2009 Medicines:                Monitored Anesthesia Care Procedure:                Pre-Anesthesia Assessment:                           - Prior to the procedure, a History and Physical                            was performed, and patient medications and                            allergies were reviewed. The patient's tolerance of                            previous anesthesia was also reviewed. The risks                            and benefits of the procedure and the sedation                            options and risks were discussed with the patient.                            All questions were answered, and informed consent                            was obtained. Prior Anticoagulants: The patient has                            taken no previous anticoagulant or antiplatelet                            agents. ASA Grade Assessment: II - A patient with                            mild systemic disease. After reviewing the risks                            and benefits, the patient was deemed in                            satisfactory condition to undergo the procedure.  After obtaining informed consent, the colonoscope                            was passed under direct vision. Throughout the                            procedure, the patient's blood pressure, pulse, and                            oxygen saturations were monitored continuously. The                            Colonoscope was introduced through the anus and                            advanced to  the the cecum, identified by                            appendiceal orifice and ileocecal valve. The                            ileocecal valve, appendiceal orifice, and rectum                            were photographed. The quality of the bowel                            preparation was good. The colonoscopy was performed                            without difficulty. The patient tolerated the                            procedure well. The bowel preparation used was                            SUPREP. Scope In: 8:17:03 AM Scope Out: 8:33:53 AM Scope Withdrawal Time: 0 hours 13 minutes 42 seconds  Total Procedure Duration: 0 hours 16 minutes 50 seconds  Findings:                 A 5 mm polyp was found in the cecum. The polyp was                            sessile. The polyp was removed with a cold snare.                            Resection and retrieval were complete.                           The exam was otherwise without abnormality on                            direct and retroflexion views. Complications:  No immediate complications. Estimated blood loss:                            None. Estimated Blood Loss:     Estimated blood loss: none. Impression:               - One 5 mm polyp in the cecum, removed with a cold                            snare. Resected and retrieved.                           - The examination was otherwise normal on direct                            and retroflexion views. Recommendation:           - Repeat colonoscopy in 5 years for surveillance.                           - Patient has a contact number available for                            emergencies. The signs and symptoms of potential                            delayed complications were discussed with the                            patient. Return to normal activities tomorrow.                            Written discharge instructions were provided to the                             patient.                           - Resume previous diet.                           - Continue present medications.                           - Await pathology results. Docia Chuck. Henrene Pastor, MD 05/12/2018 8:39:32 AM This report has been signed electronically.

## 2018-05-13 ENCOUNTER — Telehealth: Payer: Self-pay

## 2018-05-13 NOTE — Telephone Encounter (Signed)
  Follow up Call-  Call back number 05/12/2018  Post procedure Call Back phone  # [phone]  Permission to leave phone message Yes  Some recent data might be hidden     Patient questions:  Do you have a fever, pain , or abdominal swelling? No. Pain Score  0 *  Have you tolerated food without any problems? Yes.    Have you been able to return to your normal activities? Yes.    Do you have any questions about your discharge instructions: Diet   No. Medications  No. Follow up visit  No.  Do you have questions or concerns about your Care? No.  Actions: * If pain score is 4 or above: No action needed, pain <4.

## 2018-05-19 ENCOUNTER — Encounter: Payer: Self-pay | Admitting: Internal Medicine

## 2018-05-19 ENCOUNTER — Encounter: Payer: Federal, State, Local not specified - PPO | Admitting: Internal Medicine

## 2018-06-26 DIAGNOSIS — D2230 Melanocytic nevi of unspecified part of face: Secondary | ICD-10-CM | POA: Diagnosis not present

## 2018-06-26 DIAGNOSIS — L570 Actinic keratosis: Secondary | ICD-10-CM | POA: Diagnosis not present

## 2018-06-26 DIAGNOSIS — D225 Melanocytic nevi of trunk: Secondary | ICD-10-CM | POA: Diagnosis not present

## 2018-06-26 DIAGNOSIS — L821 Other seborrheic keratosis: Secondary | ICD-10-CM | POA: Diagnosis not present

## 2018-06-26 DIAGNOSIS — L820 Inflamed seborrheic keratosis: Secondary | ICD-10-CM | POA: Diagnosis not present

## 2018-06-26 DIAGNOSIS — D485 Neoplasm of uncertain behavior of skin: Secondary | ICD-10-CM | POA: Diagnosis not present

## 2018-09-01 ENCOUNTER — Encounter: Payer: Self-pay | Admitting: Family Medicine

## 2018-09-01 ENCOUNTER — Ambulatory Visit: Payer: Federal, State, Local not specified - PPO | Admitting: Family Medicine

## 2018-09-01 VITALS — BP 137/83 | HR 61 | Temp 98.1°F | Ht 72.0 in | Wt 234.4 lb

## 2018-09-01 DIAGNOSIS — Z125 Encounter for screening for malignant neoplasm of prostate: Secondary | ICD-10-CM | POA: Diagnosis not present

## 2018-09-01 DIAGNOSIS — R739 Hyperglycemia, unspecified: Secondary | ICD-10-CM

## 2018-09-01 DIAGNOSIS — E785 Hyperlipidemia, unspecified: Secondary | ICD-10-CM

## 2018-09-01 DIAGNOSIS — K429 Umbilical hernia without obstruction or gangrene: Secondary | ICD-10-CM

## 2018-09-01 DIAGNOSIS — M79601 Pain in right arm: Secondary | ICD-10-CM

## 2018-09-01 DIAGNOSIS — R109 Unspecified abdominal pain: Secondary | ICD-10-CM

## 2018-09-01 DIAGNOSIS — R0781 Pleurodynia: Secondary | ICD-10-CM | POA: Diagnosis not present

## 2018-09-01 NOTE — Progress Notes (Signed)
Subjective:  By signing my name below, I, Ralph Hayes, attest that this documentation has been prepared under the direction and in the presence of Ralph Agreste, MD Electronically Signed: Ladene Hayes, ED Scribe 09/01/2018 at 8:12 AM.   Patient ID: Ralph Hayes, male    DOB: Mar 24, 1956, 62 y.o.   MRN: [id number]  Chief Complaint  Patient presents with  . chronic condition    6 month f.u    HPI Ralph Hayes is a 62 y.o. male who presents to Primary Care at [facility] for f/u. Pt is fasting.  Hyperlipidemia Lab Results  Component Value Date   CHOL 191 02/27/2018   HDL 53 02/27/2018   LDLCALC 107 (H) 02/27/2018   TRIG 154 (H) 02/27/2018   CHOLHDL 3.6 02/27/2018   Lab Results  Component Value Date   ALT 29 02/27/2018   AST 16 02/27/2018   ALKPHOS 86 02/27/2018   BILITOT 1.2 02/27/2018  Lipitor 40 mg qd and CoQ10. - Occasional muscle cramping in the R forearm. Pt is L hand dominant but ambidextrous with sports.  L Side Pain Pt presents with daily L side pain at the lower rib area x 1 yr, worse with palpation. Denies nausea, vomiting, blood in stools, hematuria, difficulty urinating, h/o kidney stones. CT abdomen 12/2013, no acute findings.  Hyperglycemia Lab Results  Component Value Date   HGBA1C 5.3 02/27/2018  Normal.  Wt Readings from Last 3 Encounters:  09/01/18 234 lb 6.4 oz (106.3 kg)  05/12/18 230 lb (104.3 kg)  05/05/18 230 lb 12.8 oz (885.0 kg)   Umbilical Hernia Repair In 12/2013 pt had a laparoscopic umbilical hernia repair by Dr. Lucia Hayes. Still has some fullness in the area without pain.  Patient Active Problem List   Diagnosis Date Noted  . Cervical disc disorder with radiculopathy of cervical region 11/17/2014  . 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 Bradycardia 05/04/2014   Past Medical History:  Diagnosis Date  . Hyperlipidemia   . Medical history non-contributory    Past Surgical History:  Procedure Laterality Date  . HERNIA  REPAIR    . NO PAST SURGERIES    . UMBILICAL HERNIA REPAIR N/A 12/28/2016   Procedure: LAPAROSCOPIC UMBILICAL HERNIA;  Surgeon: Ralph Overall, MD;  Location: [facility];  Service: General;  Laterality: N/A;   Allergies  Allergen Reactions  . Penicillins Swelling    SWELLING REACTION UNSPECIFIED   Has patient had a PCN reaction causing immediate rash, facial/tongue/throat swelling, SOB or lightheadedness with hypotension:unsure Has patient had a PCN reaction causing severe rash involving mucus membranes or skin necrosis:unsure Has patient had a PCN reaction that required hospitalization:No Has patient had a PCN reaction occurring within the last 10 years:unsure If all of the above answers are "NO", then may proceed with Cephalosporin use. Childhood reaction   Prior to Admission medications   Medication Sig Start Date End Date Taking? Authorizing Provider  aspirin EC 81 MG tablet Take 81 mg by mouth every morning.   Yes [provider]  atorvastatin (LIPITOR) 40 MG tablet TAKE 1 TABLET BY MOUTH EVERY DAY. 02/27/18  Yes Ralph Agreste, MD  Coenzyme Q10 (COQ10 PO) Take 1 capsule by mouth 3 (three) times a week.   Yes [provider]   Social History   Socioeconomic History  . Marital status: Married    Spouse name: Not on file  . Number of children: Not on file  . Years of education: Not on file  . Highest  education level: Not on file  Occupational History  . Not on file  Social Needs  . Financial resource strain: Not on file  . Food insecurity:    Worry: Not on file    Inability: Not on file  . Transportation needs:    Medical: Not on file    Non-medical: Not on file  Tobacco Use  . Smoking status: Never Smoker  . Smokeless tobacco: Never Used  Substance and Sexual Activity  . Alcohol use: Yes    Alcohol/week: 2.0 standard drinks    Types: 2 Glasses of wine per week    Comment: twice a wk  . Drug use: No  . Sexual activity: Yes  Lifestyle  . Physical  activity:    Days per week: Not on file    Minutes per session: Not on file  . Stress: Not on file  Relationships  . Social connections:    Talks on phone: Not on file    Gets together: Not on file    Attends religious service: Not on file    Active member of club or organization: Not on file    Attends meetings of clubs or organizations: Not on file    Relationship status: Not on file  . Intimate partner violence:    Fear of current or ex partner: Not on file    Emotionally abused: Not on file    Physically abused: Not on file    Forced sexual activity: Not on file  Other Topics Concern  . Not on file  Social History Narrative   Married. Education: [employer].    Review of Systems  Constitutional: Negative for fatigue and unexpected weight change.  Eyes: Negative for visual disturbance.  Respiratory: Negative for cough, chest tightness and shortness of breath.   Cardiovascular: Negative for chest pain, palpitations and leg swelling.  Gastrointestinal: Negative for abdominal pain, blood in stool, nausea and vomiting.  Genitourinary: Negative for difficulty urinating and hematuria.  Musculoskeletal: Positive for myalgias.  Neurological: Negative for dizziness, light-headedness and headaches.      Objective:   Physical Exam  Constitutional: He is oriented to person, place, and time. He appears well-developed and well-nourished.  HENT:  Head: Normocephalic and atraumatic.  Eyes: Pupils are equal, round, and reactive to light. EOM are normal.  Neck: No JVD present. Carotid bruit is not present.  Cardiovascular: Normal rate, regular rhythm and normal heart sounds.  No murmur heard. Pulmonary/Chest: Effort normal and breath sounds normal. He has no rales.  Abdominal: There is no CVA tenderness.  Prominence at umbilicus that is reproducible.  Musculoskeletal: He exhibits no edema.  Tender at mid axillary line, mid ribs. R elbow: Nontender. FROM. Forearm and wrist nontender. Pain  free pronation and supination. No CVA tenderness.  Neurological: He is alert and oriented to person, place, and time.  Skin: Skin is warm and dry.  Psychiatric: He has a normal mood and affect.  Vitals reviewed.  Vitals:   09/01/18 0757  BP: 137/83  Pulse: 61  Temp: 98.1 F (36.7 C)  TempSrc: Oral  SpO2: 98%  Weight: 234 lb 6.4 oz (106.3 kg)  Height: 6' (1.829 m)      Assessment & Plan:  Ralph Hayes is a 62 y.o. male Hyperlipidemia, unspecified hyperlipidemia type - Plan: Comprehensive metabolic panel, Lipid panel, CK  -Tolerating current regimen, check labs.  Left flank pain Rib pain on left side - Plan: DG Ribs Unilateral W/Chest Left  -Appears to be more rib  pain than true flank pain.  Will check rib x-ray given chronicity of symptoms, then determine further follow-up if needed.  Tylenol over-the-counter if needed temporarily for now  Right arm pain - Plan: CK  -Episodic pain.  Less likely related to statin, but will check CPK.  Advised to keep a record of specific activities that cause symptoms follow-up if persistent.  Could be neuropathic/overuse syndrome.  Umbilical hernia without obstruction and without gangrene  -Prior repair with persistent prominence.  Advised to follow-up with general surgery to decide if further treatment needed.  Screening for prostate cancer - Plan: PSA  -Previous testing normal 1 year ago, too soon to repeat testing at last physical. We will repeat PSA today  Hyperglycemia - Plan: Hemoglobin A1c  -A1c looked okay last visit, will check one more time to make sure not prediabetic.  No orders of the defined types were placed in this encounter.  Patient Instructions   No change in medications for now.  I will check your cholesterol, as well as muscle enzyme test.  If you have more persistent discomfort in the right forearm, especially with certain activities, follow-up so we can discuss other potential causes or I can refer you to a hand  specialist.  Rib x-rays can be performed later this week.  The order has been placed.  Tylenol okay if needed for now.  I would recommend calling [facility] to follow-up regarding the umbilical hernia repair and persistent prominence.   Return to the clinic or go to the nearest emergency room if any of your symptoms worsen or new symptoms occur.  Thanks for coming in today.   If you have lab work done today you will be contacted with your lab results within the next 2 weeks.  If you have not heard from [place] then please contact us. The fastest way to get your results is to register for My Chart.   IF you received an x-ray today, you will receive an invoice from [facility]. Please contact [facility] at [phone] with questions or concerns regarding your invoice.   IF you received labwork today, you will receive an invoice from [facility]. Please contact [facility] at [phone] with questions or concerns regarding your invoice.   Our billing staff will not be able to assist you with questions regarding bills from these companies.  You will be contacted with the lab results as soon as they are available. The fastest way to get your results is to activate your My Chart account. Instructions are located on the last page of this paperwork. If you have not heard from [place] regarding the results in 2 weeks, please contact this office.       I personally performed the services described in this documentation, which was scribed in my presence. The recorded information has been reviewed and considered for accuracy and completeness, addended by me as needed, and agree with information above.  Signed,   [name], MD [facility] at [place].  09/01/18 10:58 AM

## 2018-09-01 NOTE — Patient Instructions (Addendum)
No change in medications for now.  I will check your cholesterol, as well as muscle enzyme test.  If you have more persistent discomfort in the right forearm, especially with certain activities, follow-up so we can discuss other potential causes or I can refer you to a hand specialist.  Rib x-rays can be performed later this week.  The order has been placed.  Tylenol okay if needed for now.  I would recommend calling [facility] to follow-up regarding the umbilical hernia repair and persistent prominence.   Return to the clinic or go to the nearest emergency room if any of your symptoms worsen or new symptoms occur.  Thanks for coming in today.   If you have lab work done today you will be contacted with your lab results within the next 2 weeks.  If you have not heard from [place] then please contact us. The fastest way to get your results is to register for My Chart.   IF you received an x-ray today, you will receive an invoice from [facility]. Please contact [facility] at [phone] with questions or concerns regarding your invoice.   IF you received labwork today, you will receive an invoice from [facility]. Please contact [facility] at [phone] with questions or concerns regarding your invoice.   Our billing staff will not be able to assist you with questions regarding bills from these companies.  You will be contacted with the lab results as soon as they are available. The fastest way to get your results is to activate your My Chart account. Instructions are located on the last page of this paperwork. If you have not heard from [place] regarding the results in 2 weeks, please contact this office.

## 2018-09-02 ENCOUNTER — Ambulatory Visit (INDEPENDENT_AMBULATORY_CARE_PROVIDER_SITE_OTHER): Payer: Federal, State, Local not specified - PPO | Admitting: Family Medicine

## 2018-09-02 ENCOUNTER — Ambulatory Visit (INDEPENDENT_AMBULATORY_CARE_PROVIDER_SITE_OTHER): Payer: Federal, State, Local not specified - PPO

## 2018-09-02 DIAGNOSIS — R109 Unspecified abdominal pain: Secondary | ICD-10-CM | POA: Diagnosis not present

## 2018-09-02 DIAGNOSIS — R0781 Pleurodynia: Secondary | ICD-10-CM | POA: Diagnosis not present

## 2018-09-02 LAB — LIPID PANEL
CHOLESTEROL TOTAL: 191 mg/dL (ref 100–199)
Chol/HDL Ratio: 3.5 ratio (ref 0.0–5.0)
HDL: 55 mg/dL (ref >39)
LDL Calculated: 115 mg/dL — ABNORMAL HIGH (ref 0–99)
Triglycerides: 106 mg/dL (ref 0–149)
VLDL CHOLESTEROL CAL: 21 mg/dL (ref 5–40)

## 2018-09-02 LAB — COMPREHENSIVE METABOLIC PANEL
A/G RATIO: 2.3 — AB (ref 1.2–2.2)
ALBUMIN: 4.3 g/dL (ref 3.6–4.8)
ALK PHOS: 99 IU/L (ref 39–117)
ALT: 31 IU/L (ref 0–44)
AST: 19 IU/L (ref 0–40)
BUN/Creatinine Ratio: 19 (ref 10–24)
BUN: 19 mg/dL (ref 8–27)
Bilirubin Total: 1.1 mg/dL (ref 0.0–1.2)
CHLORIDE: 103 mmol/L (ref 96–106)
CO2: 24 mmol/L (ref 20–29)
Calcium: 9 mg/dL (ref 8.6–10.2)
Creatinine, Ser: 0.98 mg/dL (ref 0.76–1.27)
GFR calc non Af Amer: 83 mL/min/{1.73_m2} (ref >59)
GFR, EST AFRICAN AMERICAN: 96 mL/min/{1.73_m2} (ref >59)
GLOBULIN, TOTAL: 1.9 g/dL (ref 1.5–4.5)
GLUCOSE: 101 mg/dL — AB (ref 65–99)
POTASSIUM: 4.6 mmol/L (ref 3.5–5.2)
SODIUM: 141 mmol/L (ref 134–144)
TOTAL PROTEIN: 6.2 g/dL (ref 6.0–8.5)

## 2018-09-02 LAB — CK: CK TOTAL: 136 U/L (ref 24–204)

## 2018-09-02 LAB — HEMOGLOBIN A1C
ESTIMATED AVERAGE GLUCOSE: 111 mg/dL
Hgb A1c MFr Bld: 5.5 % (ref 4.8–5.6)

## 2018-09-02 LAB — PSA: PROSTATE SPECIFIC AG, SERUM: 3.4 ng/mL (ref 0.0–4.0)

## 2018-10-30 ENCOUNTER — Other Ambulatory Visit: Payer: Self-pay | Admitting: Surgery

## 2018-11-25 ENCOUNTER — Other Ambulatory Visit: Payer: Self-pay | Admitting: Family Medicine

## 2018-11-25 DIAGNOSIS — E785 Hyperlipidemia, unspecified: Secondary | ICD-10-CM

## 2018-11-29 DIAGNOSIS — K080 Exfoliation of teeth due to systemic causes: Secondary | ICD-10-CM | POA: Diagnosis not present

## 2018-12-10 NOTE — Patient Instructions (Addendum)
MARS SCHEAFFER  12/10/2018   Your procedure is scheduled on: 12-18-2018   Report to [facility] Main  Entrance     Report to admitting at 5:30AM      Call this number if you have problems the morning of surgery [phone]      Remember: NO SOLID FOOD AFTER MIDNIGHT THE NIGHT PRIOR TO SURGERY. NOTHING BY MOUTH EXCEPT CLEAR LIQUIDS UNTIL 3 HOURS PRIOR TO [place] SURGERY. PLEASE FINISH ENSURE DRINK PER SURGEON ORDER 3 HOURS PRIOR TO SCHEDULED SURGERY TIME WHICH NEEDS TO BE COMPLETED AT ______4:30AM______. BRUSH YOUR TEETH MORNING OF SURGERY AND RINSE YOUR MOUTH OUT, NO CHEWING GUM CANDY OR MINTS.       CLEAR LIQUID DIET   Foods Allowed                                                                     Foods Excluded  Coffee and tea, regular and decaf                             liquids that you cannot  Plain Jell-O in any flavor                                             see through such as: Fruit ices (not with fruit pulp)                                     milk, soups, orange juice  Iced Popsicles                                    All solid food Carbonated beverages, regular and diet                                    Cranberry, grape and apple juices Sports drinks like Gatorade Lightly seasoned clear broth or consume(fat free) Sugar, honey syrup  Sample Menu Breakfast                                Lunch                                     Supper Cranberry juice                    Beef broth                            Chicken broth Jell-O  Grape juice                           Apple juice Coffee or tea                        Jell-O                                      Popsicle                                                Coffee or tea                        Coffee or tea  _____________________________________________________________________     Take these medicines the morning of surgery with A SIP OF  WATER: Atorvastatin                                You may not have any metal on your body including hair pins and              piercings  Do not wear jewelry, make-up, lotions, powders or perfumes, deodorant              Men may shave face and neck.   Do not bring valuables to the hospital. [place].  Contacts, dentures or bridgework may not be worn into surgery.      Patients discharged the day of surgery will not be allowed to drive home. IF YOU ARE HAVING SURGERY AND GOING HOME THE SAME DAY, YOU MUST HAVE AN ADULT TO DRIVE YOU HOME AND BE WITH YOU FOR 24 HOURS. YOU MAY GO HOME BY TAXI OR UBER OR ORTHERWISE, BUT AN ADULT MUST ACCOMPANY YOU HOME AND STAY WITH YOU FOR 24 HOURS.  Name and phone number of your driver:  Special Instructions: N/A              Please read over the following fact sheets you were given: _____________________________________________________________________             [facility] - Preparing for Surgery Before surgery, you can play an important role.  Because skin is not sterile, your skin needs to be as free of germs as possible.  You can reduce the number of germs on your skin by washing with CHG (chlorahexidine gluconate) soap before surgery.  CHG is an antiseptic cleaner which kills germs and bonds with the skin to continue killing germs even after washing. Please DO NOT use if you have an allergy to CHG or antibacterial soaps.  If your skin becomes reddened/irritated stop using the CHG and inform your nurse when you arrive at Short Stay. Do not shave (including legs and underarms) for at least 48 hours prior to the first CHG shower.  You may shave your face/neck. Please follow these instructions carefully:  1.  Shower with CHG Soap the night before surgery and the  morning of Surgery.  2.  If you choose to wash your hair, wash  your hair first as usual with your  normal  shampoo.  3.  After you shampoo,  rinse your hair and body thoroughly to remove the  shampoo.                           4.  Use CHG as you would any other liquid soap.  You can apply chg directly  to the skin and wash                       Gently with a scrungie or clean washcloth.  5.  Apply the CHG Soap to your body ONLY FROM THE NECK DOWN.   Do not use on face/ open                           Wound or open sores. Avoid contact with eyes, ears mouth and genitals (private parts).                       Wash face,  Genitals (private parts) with your normal soap.             6.  Wash thoroughly, paying special attention to the area where your surgery  will be performed.  7.  Thoroughly rinse your body with warm water from the neck down.  8.  DO NOT shower/wash with your normal soap after using and rinsing off  the CHG Soap.                9.  Pat yourself dry with a clean towel.            10.  Wear clean pajamas.            11.  Place clean sheets on your bed the night of your first shower and do not  sleep with pets. Day of Surgery : Do not apply any lotions/deodorants the morning of surgery.  Please wear clean clothes to the hospital/surgery center.  FAILURE TO FOLLOW THESE INSTRUCTIONS MAY RESULT IN THE CANCELLATION OF YOUR SURGERY PATIENT SIGNATURE_________________________________  NURSE SIGNATURE__________________________________  ________________________________________________________________________

## 2018-12-11 ENCOUNTER — Encounter (HOSPITAL_COMMUNITY)
Admission: RE | Admit: 2018-12-11 | Discharge: 2018-12-11 | Disposition: A | Discharge status: Home / Self Care | Payer: Federal, State, Local not specified - PPO | Source: Ambulatory Visit | Attending: Surgery | Admitting: Surgery

## 2018-12-11 ENCOUNTER — Encounter (HOSPITAL_COMMUNITY): Payer: Self-pay

## 2018-12-11 ENCOUNTER — Other Ambulatory Visit: Payer: Self-pay

## 2018-12-11 DIAGNOSIS — Z01812 Encounter for preprocedural laboratory examination: Secondary | ICD-10-CM | POA: Diagnosis not present

## 2018-12-11 HISTORY — DX: Umbilical hernia without obstruction or gangrene: K42.9

## 2018-12-11 LAB — CBC
HCT: 48.3 % (ref 39.0–52.0)
Hemoglobin: 15 g/dL (ref 13.0–17.0)
MCH: 28.1 pg (ref 26.0–34.0)
MCHC: 31.1 g/dL (ref 30.0–36.0)
MCV: 90.6 fL (ref 80.0–100.0)
Platelets: 245 10*3/uL (ref 150–400)
RBC: 5.33 MIL/uL (ref 4.22–5.81)
RDW: 13.1 % (ref 11.5–15.5)
WBC: 5.7 10*3/uL (ref 4.0–10.5)
nRBC: 0 % (ref 0.0–0.2)

## 2018-12-17 NOTE — Anesthesia Preprocedure Evaluation (Addendum)
Anesthesia Evaluation  Patient identified by MRN, date of birth, ID band Patient awake    Reviewed: Allergy & Precautions, NPO status , Patient's Chart, lab work & pertinent test results  Airway Mallampati: II  TM Distance: >3 FB Neck ROM: Full    Dental  (+) Dental Advisory Given   Pulmonary neg pulmonary ROS,    breath sounds clear to auscultation       Cardiovascular negative cardio ROS   Rhythm:Regular Rate:Normal     Neuro/Psych  Neuromuscular disease    GI/Hepatic negative GI ROS, Neg liver ROS,   Endo/Other  negative endocrine ROS  Renal/GU negative Renal ROS     Musculoskeletal  (+) Arthritis ,   Abdominal   Peds  Hematology negative hematology ROS (+)   Anesthesia Other Findings   Reproductive/Obstetrics                            Lab Results  Component Value Date   WBC 5.7 12/11/2018   HGB 15.0 12/11/2018   HCT 48.3 12/11/2018   MCV 90.6 12/11/2018   PLT 245 12/11/2018    Anesthesia Physical Anesthesia Plan  ASA: II  Anesthesia Plan: General   Post-op Pain Management:    Induction: Intravenous  PONV Risk Score and Plan: 2 and Dexamethasone and Ondansetron  Airway Management Planned: Oral ETT  Additional Equipment:   Intra-op Plan:   Post-operative Plan: Extubation in OR  Informed Consent: I have reviewed the patients History and Physical, chart, labs and discussed the procedure including the risks, benefits and alternatives for the proposed anesthesia with the patient or authorized representative who has indicated his/her understanding and acceptance.     Dental advisory given  Plan Discussed with: CRNA  Anesthesia Plan Comments:        Anesthesia Quick Evaluation

## 2018-12-17 NOTE — H&P (Signed)
Ralph Hayes  Location: [facility] Surgery Patient #: [id number] DOB: 01-30-1956 Married / Language: English / Race: White Male  History of Present Illness   The patient is a 63 year old male who presents with a complaint of umbilical hernia.  His PCP is Dr. [name]  He comes by himself.  There was some confusion as to whether he had an appt today. So I saw him - nothing has changed since December. He is on for surgery for 12/18/2018. He had no further questions.  He's noticed a persistent bulge since the operation. It does change with Valsalva. He's had no pain at the umbilical site. Also incisions look good. To me, the findings are odd. He appears to have a recurrent hernia, but I thought had adequate coverage with mesh. I discussed the options of continued observation versus proceeding with repair of recurrent hernia. I would probably laparoscope him, and do an open repair of the hernia this time.  I discussed the indications and complications of hernia surgery with the patient. I discussed both the laparoscopic and open approach to hernia repair.. The potential risks of hernia surgery include, but are not limited to, bleeding, infection, open surgery, nerve injury, and recurrence of the hernia. I provided the patient literature about hernia surgery. We talked about the use of mesh in hernias and their risks. The risk of mesh include chronic infection, erosion to other structures, and chronic pain.  He is going on a cruise in January and is not ready to make a decision for surgery today. He knows to call back to plan surgery.   Past Medical History 1. Laparoscopic umbilical hernia repair on 12/28/2016 - D. Rey Fors 2. Hyperlipidemia  Social History: Married. He has two daughtes - 30 and 24. His 31 yo daughter has two children and live here in [place]. Works for [employer] as an Chief Financial Officer   Allergies (April Staton,  [place]; 12/10/2018 9:02 AM) PenicillAMINE *ASSORTED CLASSES*   Medication History (April Staton, CMA; 12/10/2018 9:02 AM) Aspirin (81MG  Capsule, Oral daily) Active. Atorvastatin Calcium (40MG  Tablet, Oral) Active. Medications Reconciled  Vitals (April Staton CMA; 12/10/2018 9:03 AM) 12/10/2018 9:02 AM Weight: 234.5 lb Height: 72in Body Surface Area: 2.28 m Body Mass Index: 31.8 kg/m  Temp.: 97.53F(Oral)  Pulse: 66 (Regular)  Resp.: 97 (Unlabored)  BP: 138/82 (Sitting, Left Arm, Standard)  Physical Exam  General: WN WM alert and generally healthy appearing. HEENT: Normal. Pupils equal.  Neck: Supple. No mass. No thyroid mass.   Lungs: Clear to auscultation Heart: RRR  Abdomen: Soft. No mass. No tenderness.  He has a bulge at the umbilicus. The fascial defect is about 2.0 cm. It is a little odd. I don't know if the mesh slipped or he has a residual sac. It does appear to be a hernia. I drew on his abdomen to outline what the incision would look like.  Extremities: Good strength and ROM in upper and lower extremities.  Assessment & Plan  1. UMBILICAL HERNIA WITHOUT OBSTRUCTION OR GANGRENE (K42.9)  Story: Laparoscopic repair of umbilical hernia - 0/03/44  2.  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 Hyperlipidemia  Alphonsa Overall, MD, [facility] Surgery Pager: [phone] Office phone:  [phone]

## 2018-12-18 ENCOUNTER — Ambulatory Visit (HOSPITAL_COMMUNITY): Payer: Federal, State, Local not specified - PPO | Admitting: Physician Assistant

## 2018-12-18 ENCOUNTER — Ambulatory Visit (HOSPITAL_COMMUNITY): Payer: Federal, State, Local not specified - PPO | Admitting: Certified Registered"

## 2018-12-18 ENCOUNTER — Encounter (HOSPITAL_COMMUNITY): Payer: Self-pay

## 2018-12-18 ENCOUNTER — Encounter (HOSPITAL_COMMUNITY)
Admission: RE | Disposition: A | Discharge status: Home / Self Care | Payer: Self-pay | Source: Ambulatory Visit | Attending: Surgery

## 2018-12-18 ENCOUNTER — Ambulatory Visit (HOSPITAL_COMMUNITY)
Admission: RE | Admit: 2018-12-18 | Discharge: 2018-12-18 | Disposition: A | Discharge status: Home / Self Care | Payer: Federal, State, Local not specified - PPO | Source: Ambulatory Visit | Attending: Surgery | Admitting: Surgery

## 2018-12-18 DIAGNOSIS — E785 Hyperlipidemia, unspecified: Secondary | ICD-10-CM | POA: Diagnosis not present

## 2018-12-18 DIAGNOSIS — Z888 Allergy status to other drugs, medicaments and biological substances status: Secondary | ICD-10-CM | POA: Diagnosis not present

## 2018-12-18 DIAGNOSIS — K429 Umbilical hernia without obstruction or gangrene: Secondary | ICD-10-CM | POA: Diagnosis not present

## 2018-12-18 DIAGNOSIS — Z7982 Long term (current) use of aspirin: Secondary | ICD-10-CM | POA: Diagnosis not present

## 2018-12-18 DIAGNOSIS — Z886 Allergy status to analgesic agent status: Secondary | ICD-10-CM | POA: Insufficient documentation

## 2018-12-18 HISTORY — PX: UMBILICAL HERNIA REPAIR: SHX196

## 2018-12-18 SURGERY — REPAIR, HERNIA, UMBILICAL, LAPAROSCOPIC
Anesthesia: General | Site: Abdomen

## 2018-12-18 MED ORDER — SUGAMMADEX SODIUM 200 MG/2ML IV SOLN
INTRAVENOUS | Status: DC | PRN
  Administered 2018-12-18: 200 mg via INTRAVENOUS

## 2018-12-18 MED ORDER — BUPIVACAINE LIPOSOME 1.3 % IJ SUSP
20.0000 mL | Freq: Once | INTRAMUSCULAR | Status: DC
  Filled 2018-12-18: qty 20

## 2018-12-18 MED ORDER — HYDROCODONE-ACETAMINOPHEN 5-325 MG PO TABS: 1.0000 | ORAL_TABLET | Freq: Four times a day (QID) | ORAL | 0 refills | Status: DC | PRN

## 2018-12-18 MED ORDER — CEFAZOLIN SODIUM-DEXTROSE 2-4 GM/100ML-% IV SOLN
INTRAVENOUS | Status: AC
  Filled 2018-12-18: qty 100

## 2018-12-18 MED ORDER — LIDOCAINE 2% (20 MG/ML) 5 ML SYRINGE
INTRAMUSCULAR | Status: AC
  Filled 2018-12-18: qty 5

## 2018-12-18 MED ORDER — 0.9 % SODIUM CHLORIDE (POUR BTL) OPTIME
TOPICAL | Status: DC | PRN
  Administered 2018-12-18: 1000 mL

## 2018-12-18 MED ORDER — CHLORHEXIDINE GLUCONATE CLOTH 2 % EX PADS: 6.0000 | MEDICATED_PAD | Freq: Once | CUTANEOUS | Status: DC

## 2018-12-18 MED ORDER — BUPIVACAINE HCL (PF) 0.25 % IJ SOLN
INTRAMUSCULAR | Status: AC
  Filled 2018-12-18: qty 30

## 2018-12-18 MED ORDER — MIDAZOLAM HCL 2 MG/2ML IJ SOLN
INTRAMUSCULAR | Status: AC
  Filled 2018-12-18: qty 2

## 2018-12-18 MED ORDER — LIDOCAINE 2% (20 MG/ML) 5 ML SYRINGE
INTRAMUSCULAR | Status: DC | PRN
  Administered 2018-12-18: 1.5 mg/kg/h via INTRAVENOUS

## 2018-12-18 MED ORDER — FENTANYL CITRATE (PF) 100 MCG/2ML IJ SOLN
INTRAMUSCULAR | Status: AC
  Filled 2018-12-18: qty 2

## 2018-12-18 MED ORDER — ACETAMINOPHEN 500 MG PO TABS
1000.0000 mg | ORAL_TABLET | ORAL | Status: AC
  Administered 2018-12-18: 1000 mg via ORAL
  Filled 2018-12-18: qty 2

## 2018-12-18 MED ORDER — ROCURONIUM BROMIDE 10 MG/ML (PF) SYRINGE
PREFILLED_SYRINGE | INTRAVENOUS | Status: DC | PRN
  Administered 2018-12-18: 50 mg via INTRAVENOUS

## 2018-12-18 MED ORDER — FENTANYL CITRATE (PF) 250 MCG/5ML IJ SOLN
INTRAMUSCULAR | Status: DC | PRN
  Administered 2018-12-18: 100 ug via INTRAVENOUS

## 2018-12-18 MED ORDER — EPHEDRINE SULFATE-NACL 50-0.9 MG/10ML-% IV SOSY
PREFILLED_SYRINGE | INTRAVENOUS | Status: DC | PRN
  Administered 2018-12-18: 5 mg via INTRAVENOUS
  Administered 2018-12-18: 10 mg via INTRAVENOUS
  Administered 2018-12-18: 5 mg via INTRAVENOUS
  Administered 2018-12-18: 10 mg via INTRAVENOUS

## 2018-12-18 MED ORDER — BUPIVACAINE HCL (PF) 0.25 % IJ SOLN
INTRAMUSCULAR | Status: DC | PRN
  Administered 2018-12-18: 30 mL

## 2018-12-18 MED ORDER — ONDANSETRON HCL 4 MG/2ML IJ SOLN
INTRAMUSCULAR | Status: AC
  Filled 2018-12-18: qty 2

## 2018-12-18 MED ORDER — BUPIVACAINE HCL (PF) 0.5 % IJ SOLN
INTRAMUSCULAR | Status: AC
  Filled 2018-12-18: qty 30

## 2018-12-18 MED ORDER — ONDANSETRON HCL 4 MG/2ML IJ SOLN
INTRAMUSCULAR | Status: DC | PRN
  Administered 2018-12-18: 4 mg via INTRAVENOUS

## 2018-12-18 MED ORDER — PROPOFOL 10 MG/ML IV BOLUS
INTRAVENOUS | Status: AC
  Filled 2018-12-18: qty 40

## 2018-12-18 MED ORDER — FENTANYL CITRATE (PF) 100 MCG/2ML IJ SOLN: 25.0000 ug | INTRAMUSCULAR | Status: DC | PRN

## 2018-12-18 MED ORDER — MIDAZOLAM HCL 2 MG/2ML IJ SOLN
INTRAMUSCULAR | Status: DC | PRN
  Administered 2018-12-18: 2 mg via INTRAVENOUS

## 2018-12-18 MED ORDER — SUGAMMADEX SODIUM 200 MG/2ML IV SOLN
INTRAVENOUS | Status: AC
  Filled 2018-12-18: qty 2

## 2018-12-18 MED ORDER — KETAMINE HCL 10 MG/ML IJ SOLN
INTRAMUSCULAR | Status: DC | PRN
  Administered 2018-12-18 (×2): 20 mg via INTRAVENOUS

## 2018-12-18 MED ORDER — PROPOFOL 10 MG/ML IV BOLUS
INTRAVENOUS | Status: DC | PRN
  Administered 2018-12-18: 160 mg via INTRAVENOUS

## 2018-12-18 MED ORDER — CEFAZOLIN SODIUM-DEXTROSE 2-4 GM/100ML-% IV SOLN
2.0000 g | INTRAVENOUS | Status: AC
  Administered 2018-12-18: 2 g via INTRAVENOUS

## 2018-12-18 MED ORDER — DEXAMETHASONE SODIUM PHOSPHATE 10 MG/ML IJ SOLN
INTRAMUSCULAR | Status: DC | PRN
  Administered 2018-12-18: 8 mg via INTRAVENOUS

## 2018-12-18 MED ORDER — LIDOCAINE 2% (20 MG/ML) 5 ML SYRINGE
INTRAMUSCULAR | Status: DC | PRN
  Administered 2018-12-18: 60 mg via INTRAVENOUS

## 2018-12-18 MED ORDER — GABAPENTIN 300 MG PO CAPS
300.0000 mg | ORAL_CAPSULE | ORAL | Status: AC
  Administered 2018-12-18: 300 mg via ORAL
  Filled 2018-12-18: qty 1

## 2018-12-18 MED ORDER — ONDANSETRON HCL 4 MG/2ML IJ SOLN: 4.0000 mg | Freq: Once | INTRAMUSCULAR | Status: DC | PRN

## 2018-12-18 MED ORDER — LACTATED RINGERS IV SOLN
INTRAVENOUS | Status: DC
  Administered 2018-12-18: 06:00:00 via INTRAVENOUS

## 2018-12-18 MED ORDER — DEXAMETHASONE SODIUM PHOSPHATE 10 MG/ML IJ SOLN
INTRAMUSCULAR | Status: AC
  Filled 2018-12-18: qty 1

## 2018-12-18 MED ORDER — KETAMINE HCL 10 MG/ML IJ SOLN
INTRAMUSCULAR | Status: AC
  Filled 2018-12-18: qty 1

## 2018-12-18 MED ORDER — EPHEDRINE 5 MG/ML INJ
INTRAVENOUS | Status: AC
  Filled 2018-12-18: qty 10

## 2018-12-18 MED ORDER — LIDOCAINE HCL 2 % IJ SOLN
INTRAMUSCULAR | Status: AC
  Filled 2018-12-18: qty 20

## 2018-12-18 SURGICAL SUPPLY — 35 items
ADH SKN CLS APL DERMABOND .7 (GAUZE/BANDAGES/DRESSINGS) ×1
BINDER ABDOMINAL 12 ML 46-62 (SOFTGOODS) ×1 IMPLANT
CABLE HIGH FREQUENCY MONO STRZ (ELECTRODE) ×2 IMPLANT
CHLORAPREP W/TINT 26ML (MISCELLANEOUS) ×2 IMPLANT
COVER SURGICAL LIGHT HANDLE (MISCELLANEOUS) ×2 IMPLANT
COVER WAND RF STERILE (DRAPES) ×1 IMPLANT
DECANTER SPIKE VIAL GLASS SM (MISCELLANEOUS) ×2 IMPLANT
DERMABOND ADVANCED (GAUZE/BANDAGES/DRESSINGS) ×1
DERMABOND ADVANCED .7 DNX12 (GAUZE/BANDAGES/DRESSINGS) IMPLANT
DEVICE TROCAR PUNCTURE CLOSURE (ENDOMECHANICALS) ×2 IMPLANT
DRAPE INCISE IOBAN 66X45 STRL (DRAPES) ×2 IMPLANT
ELECT REM PT RETURN 15FT ADLT (MISCELLANEOUS) ×2 IMPLANT
GLOVE BIOGEL PI IND STRL 6.5 (GLOVE) IMPLANT
GLOVE BIOGEL PI IND STRL 7.0 (GLOVE) IMPLANT
GLOVE BIOGEL PI INDICATOR 6.5 (GLOVE) ×1
GLOVE BIOGEL PI INDICATOR 7.0 (GLOVE) ×2
GLOVE SURG SIGNA 7.5 PF LTX (GLOVE) ×2 IMPLANT
GLOVE SURG SS PI 6.0 STRL IVOR (GLOVE) ×1 IMPLANT
GLOVE SURG SS PI 7.0 STRL IVOR (GLOVE) ×1 IMPLANT
GOWN STRL REUS W/TWL XL LVL3 (GOWN DISPOSABLE) ×6 IMPLANT
KIT BASIN OR (CUSTOM PROCEDURE TRAY) ×2 IMPLANT
NDL SPNL 22GX3.5 QUINCKE BK (NEEDLE) ×1 IMPLANT
NEEDLE SPNL 22GX3.5 QUINCKE BK (NEEDLE) ×2 IMPLANT
PENCIL HANDSWITCHING (ELECTRODE) ×1 IMPLANT
SCISSORS LAP 5X35 DISP (ENDOMECHANICALS) ×1 IMPLANT
SET TUBE SMOKE EVAC HIGH FLOW (TUBING) ×1 IMPLANT
SLEEVE XCEL OPT CAN 5 100 (ENDOMECHANICALS) ×2 IMPLANT
SUT MNCRL AB 4-0 PS2 18 (SUTURE) ×2 IMPLANT
SUT NOVA NAB GS-21 0 18 T12 DT (SUTURE) ×3 IMPLANT
SUT VIC AB 3-0 SH 18 (SUTURE) ×1 IMPLANT
TOWEL OR 17X26 10 PK STRL BLUE (TOWEL DISPOSABLE) ×2 IMPLANT
TOWEL OR NON WOVEN STRL DISP B (DISPOSABLE) ×2 IMPLANT
TRAY LAPAROSCOPIC (CUSTOM PROCEDURE TRAY) ×2 IMPLANT
TROCAR BLADELESS OPT 5 100 (ENDOMECHANICALS) ×2 IMPLANT
TROCAR XCEL NON-BLD 11X100MML (ENDOMECHANICALS) ×1 IMPLANT

## 2018-12-18 NOTE — Anesthesia Postprocedure Evaluation (Signed)
Anesthesia Post Note  Patient: Ralph Hayes  Procedure(s) Performed: LAPAROSCOPIC EXPLORATION  AND REPAIR OF UMBILICAL HERNIA ERAS PATHWAY (N/A Abdomen)     Patient location during evaluation: PACU Anesthesia Type: General Level of consciousness: awake and alert Pain management: pain level controlled Vital Signs Assessment: post-procedure vital signs reviewed and stable Respiratory status: spontaneous breathing, nonlabored ventilation, respiratory function stable and patient connected to nasal cannula oxygen Cardiovascular status: blood pressure returned to baseline and stable Postop Assessment: no apparent nausea or vomiting Anesthetic complications: no    Last Vitals:  Vitals:   12/18/18 0920 12/18/18 0947  BP: 134/82 138/88  Pulse:  74  Resp: 18 18  Temp: 36.5 C (!) 36.3 C  SpO2: 96% 97%    Last Pain:  Vitals:   12/18/18 0920  TempSrc:   PainSc: 0-No pain                 Tiajuana Amass

## 2018-12-18 NOTE — Transfer of Care (Signed)
Immediate Anesthesia Transfer of Care Note  Patient: Ralph Hayes  Procedure(s) Performed: LAPAROSCOPIC EXPLORATION  AND REPAIR OF UMBILICAL HERNIA ERAS PATHWAY (N/A Abdomen)  Patient Location: PACU  Anesthesia Type:General  Level of Consciousness: awake, alert  and oriented  Airway & Oxygen Therapy: Patient Spontanous Breathing and Patient connected to face mask oxygen  Post-op Assessment: Report given to RN and Post -op Vital signs reviewed and stable  Post vital signs: Reviewed and stable  Last Vitals:  Vitals Value Taken Time  BP    Temp    Pulse 75 12/18/2018  8:42 AM  Resp    SpO2 100 % 12/18/2018  8:42 AM  Vitals shown include unvalidated device data.  Last Pain:  Vitals:   12/18/18 0559  TempSrc:   PainSc: 0-No pain         Complications: No apparent anesthesia complications

## 2018-12-18 NOTE — Anesthesia Procedure Notes (Signed)
Procedure Name: Intubation Date/Time: 12/18/2018 7:37 AM Performed by: Eben Burow, CRNA Pre-anesthesia Checklist: Patient identified, Emergency Drugs available, Suction available, Patient being monitored and Timeout performed Patient Re-evaluated:Patient Re-evaluated prior to induction Oxygen Delivery Method: Circle system utilized Preoxygenation: Pre-oxygenation with 100% oxygen Induction Type: IV induction Ventilation: Mask ventilation without difficulty Laryngoscope Size: Mac and 4 Tube type: Oral Tube size: 7.5 mm Number of attempts: 1 Airway Equipment and Method: Stylet Placement Confirmation: ETT inserted through vocal cords under direct vision,  positive ETCO2 and breath sounds checked- equal and bilateral Secured at: 22 cm Tube secured with: Tape Dental Injury: Teeth and Oropharynx as per pre-operative assessment

## 2018-12-18 NOTE — Op Note (Signed)
OPERATIVE NOTE  12/18/2018  8:35 AM  PATIENT:  Ralph Hayes, 63 y.o., male, MRN: [id number]  PREOP DIAGNOSIS:  recurrent umblical hernia  POSTOP DIAGNOSIS:   Umbilical fascial defect, previous mesh repair intact  PROCEDURE:   Procedure(s): LAPAROSCOPIC EXPLORATION  AND REPAIR OF UMBILICAL HERNIA  (fascial defect) [photo at the end of dictation]  SURGEON:   Alphonsa Overall, M.D.  ASSISTANT:   None  ANESTHESIA:   general  Anesthesiologist: Suzette Battiest, MD CRNA: Eben Burow, CRNA  General  EBL:  minimal  ml  BLOOD ADMINISTERED: none  DRAINS: none   LOCAL MEDICATIONS USED:   30 cc of 1/4% marcaine  SPECIMEN:   None  COUNTS CORRECT:  YES  INDICATIONS FOR PROCEDURE:  Ralph Hayes is a 63 y.o. (DOB: 11/12/1955) white male whose primary care physician is [name], MD and comes for laparoscopic exploration and repair of umbilical hernia.     I did a prior laparoscopic umbilical hernia repair on 12/28/2016.  He has noticed a bulge at his umbilicus since then.  The indications and risks of the surgery were explained to the patient.  The risks include, but are not limited to, infection, bleeding, and nerve injury.  OPERATIVE NOTE:  The patient was taken to room [facility] at [facility].  He underwent a general anesthesia.  He was given 2 grams of Ancef at the beginning of the operation.   A time out was held and the surgical checklist run.   The abdomen was prepped with Cholroprep, covered with ioban, and sterilely draped.    I first accessed his abdominal cavity with a 5 mm Ethicon Optiview trocar.  Abdominal exploration revealed the prior mesh repair intact with minimal omental adhesions.  Photos were take.   He had a 2.5 cm protruding mass at his umbilicus.   I made a infraumbilical "smiling" incision and cut down to the rectus fascia.  He had a 2.5 cm fascial defect with preperitoneal fat at the umbilicus.  I amputated the preperitoneal fat.  Then  I closed the defect with  six interrupted 2-0 Novafil.   I then tacked the umbilical skin down to the fascia with 3-0 vicryl.  I used 3-0 vicryl for the subcutaneous layer.  The incision was closed with 5-0 Monocryl and painted with tincture of Benzoin and closed with Steristrips.  An abdominal binder was placed.   The sponge and needle count were correct.  The patient was transferred to the recovery room in good condition.   The plan is to discharge the patient today.  Type of repair - primary suture  (choices - primary suture, mesh, or component)  Name of mesh - No mesh used this case.  He had prior Parietex mesh that was intact.  Size of mesh (from prior case) - Length 12 cm, Width 12 cm  Mesh overlap (from prior case) - 5 cm  Placement of mesh (from prior case) - beneath fascia and into peritoneal cavity  (choices - beneath fascia and into peritoneal cavity, beneath fascia but external to peritoneal cavity, between the muscle and fascia, above or external to fascia)    Prior Paritetex mesh intact and covering fascial defect.  View from LUQ to RLQ.  Alphonsa Overall, MD, [facility] Surgery Pager: [phone] Office phone:  [phone]

## 2018-12-18 NOTE — Discharge Instructions (Addendum)
[facility] [place] [facility] - DISCHARGE INSTRUCTIONS TO PATIENT  Return to work on:  12/22/2018  Activity:  Driving - 1 to 3 days, should be feeling well   Lifting - No lifting more than 15 pounds for 4 weeks.  Wound Care:   May shower in 2 days  Diet:  As tolerated  Follow up appointment:  Call Dr. Pollie Friar office [facility]) at [phone] for an appointment in 2 to 3 weeks.  Medications and dosages:  Resume your home medications.  You have a prescription for:  Vicodin  Call Dr. Lucia Gaskins or his office  [phone]) if you have:  Temperature greater than 100.4,  Persistent nausea and vomiting,  Severe uncontrolled pain,  Redness, tenderness, or signs of infection (pain, swelling, redness, odor or green/yellow discharge around the site),  Difficulty breathing, headache or visual disturbances,  Any other questions or concerns you may have after discharge.  In an emergency, call 911 or go to an Emergency Department at a nearby hospital.

## 2018-12-18 NOTE — Interval H&P Note (Signed)
History and Physical Interval Note:  12/18/2018 7:21 AM  Ralph Hayes  has presented today for surgery, with the diagnosis of recurrent umblical hernia  The various methods of treatment have been discussed with the patient and family.  Wife, Izora Gala, at bedside.   After consideration of risks, benefits and other options for treatment, the patient has consented to  Procedure(s): [place] (N/A) as a surgical intervention .  The patient's history has been reviewed, patient examined, no change in status, stable for surgery.  I have reviewed the patient's chart and labs.  Questions were answered to the patient's satisfaction.     Shann Medal

## 2018-12-19 ENCOUNTER — Encounter (HOSPITAL_COMMUNITY): Payer: Self-pay | Admitting: Surgery

## 2019-07-15 DIAGNOSIS — D2230 Melanocytic nevi of unspecified part of face: Secondary | ICD-10-CM | POA: Diagnosis not present

## 2019-07-15 DIAGNOSIS — L821 Other seborrheic keratosis: Secondary | ICD-10-CM | POA: Diagnosis not present

## 2019-07-15 DIAGNOSIS — Z23 Encounter for immunization: Secondary | ICD-10-CM | POA: Diagnosis not present

## 2019-07-15 DIAGNOSIS — L570 Actinic keratosis: Secondary | ICD-10-CM | POA: Diagnosis not present

## 2019-07-15 DIAGNOSIS — D225 Melanocytic nevi of trunk: Secondary | ICD-10-CM | POA: Diagnosis not present

## 2019-12-02 DIAGNOSIS — H2513 Age-related nuclear cataract, bilateral: Secondary | ICD-10-CM | POA: Insufficient documentation

## 2019-12-03 DIAGNOSIS — H2513 Age-related nuclear cataract, bilateral: Secondary | ICD-10-CM | POA: Diagnosis not present

## 2019-12-03 DIAGNOSIS — H35362 Drusen (degenerative) of macula, left eye: Secondary | ICD-10-CM | POA: Diagnosis not present

## 2019-12-03 DIAGNOSIS — Z9889 Other specified postprocedural states: Secondary | ICD-10-CM | POA: Diagnosis not present

## 2020-01-04 ENCOUNTER — Encounter: Payer: Self-pay | Admitting: Family Medicine

## 2020-03-04 ENCOUNTER — Telehealth: Payer: Self-pay | Admitting: Family Medicine

## 2020-03-04 ENCOUNTER — Other Ambulatory Visit: Payer: Self-pay

## 2020-03-04 DIAGNOSIS — E785 Hyperlipidemia, unspecified: Secondary | ICD-10-CM

## 2020-03-04 DIAGNOSIS — R739 Hyperglycemia, unspecified: Secondary | ICD-10-CM

## 2020-03-04 DIAGNOSIS — Z131 Encounter for screening for diabetes mellitus: Secondary | ICD-10-CM

## 2020-03-04 DIAGNOSIS — Z1211 Encounter for screening for malignant neoplasm of colon: Secondary | ICD-10-CM

## 2020-03-04 NOTE — Telephone Encounter (Signed)
Labs placed.

## 2020-03-04 NOTE — Telephone Encounter (Signed)
Pt scheduled physical for 04/01/20. Labs are scheduled for 03/29/20. Needing orders in for that physical appt. Please advise

## 2020-03-29 ENCOUNTER — Ambulatory Visit (INDEPENDENT_AMBULATORY_CARE_PROVIDER_SITE_OTHER): Payer: Federal, State, Local not specified - PPO | Admitting: Emergency Medicine

## 2020-03-29 ENCOUNTER — Other Ambulatory Visit: Payer: Self-pay

## 2020-03-29 DIAGNOSIS — R739 Hyperglycemia, unspecified: Secondary | ICD-10-CM

## 2020-03-29 DIAGNOSIS — E785 Hyperlipidemia, unspecified: Secondary | ICD-10-CM | POA: Diagnosis not present

## 2020-03-29 DIAGNOSIS — Z1211 Encounter for screening for malignant neoplasm of colon: Secondary | ICD-10-CM | POA: Diagnosis not present

## 2020-03-29 DIAGNOSIS — Z131 Encounter for screening for diabetes mellitus: Secondary | ICD-10-CM | POA: Diagnosis not present

## 2020-03-30 LAB — HEMOGLOBIN A1C
Est. average glucose Bld gHb Est-mCnc: 108 mg/dL
Hgb A1c MFr Bld: 5.4 % (ref 4.8–5.6)

## 2020-03-30 LAB — CMP14+EGFR
ALT: 43 IU/L (ref 0–44)
AST: 23 IU/L (ref 0–40)
Albumin/Globulin Ratio: 2.2 (ref 1.2–2.2)
Albumin: 4.3 g/dL (ref 3.8–4.8)
Alkaline Phosphatase: 97 IU/L (ref 48–121)
BUN/Creatinine Ratio: 14 (ref 10–24)
BUN: 14 mg/dL (ref 8–27)
Bilirubin Total: 1.3 mg/dL — ABNORMAL HIGH (ref 0.0–1.2)
CO2: 23 mmol/L (ref 20–29)
Calcium: 8.8 mg/dL (ref 8.6–10.2)
Chloride: 103 mmol/L (ref 96–106)
Creatinine, Ser: 1.01 mg/dL (ref 0.76–1.27)
GFR calc Af Amer: 91 mL/min/{1.73_m2} (ref >59)
GFR calc non Af Amer: 79 mL/min/{1.73_m2} (ref >59)
Globulin, Total: 2 g/dL (ref 1.5–4.5)
Glucose: 103 mg/dL — ABNORMAL HIGH (ref 65–99)
Potassium: 4.2 mmol/L (ref 3.5–5.2)
Sodium: 141 mmol/L (ref 134–144)
Total Protein: 6.3 g/dL (ref 6.0–8.5)

## 2020-03-30 LAB — LIPID PANEL
Chol/HDL Ratio: 4 ratio (ref 0.0–5.0)
Cholesterol, Total: 222 mg/dL — ABNORMAL HIGH (ref 100–199)
HDL: 55 mg/dL (ref >39)
LDL Chol Calc (NIH): 131 mg/dL — ABNORMAL HIGH (ref 0–99)
Triglycerides: 201 mg/dL — ABNORMAL HIGH (ref 0–149)
VLDL Cholesterol Cal: 36 mg/dL (ref 5–40)

## 2020-03-30 LAB — PSA: Prostate Specific Ag, Serum: 3.8 ng/mL (ref 0.0–4.0)

## 2020-04-01 ENCOUNTER — Ambulatory Visit (INDEPENDENT_AMBULATORY_CARE_PROVIDER_SITE_OTHER): Payer: Federal, State, Local not specified - PPO | Admitting: Family Medicine

## 2020-04-01 VITALS — BP 126/82 | HR 60 | Temp 97.6°F | Ht 72.0 in | Wt 231.0 lb

## 2020-04-01 DIAGNOSIS — Z0000 Encounter for general adult medical examination without abnormal findings: Secondary | ICD-10-CM

## 2020-04-01 DIAGNOSIS — Z0001 Encounter for general adult medical examination with abnormal findings: Secondary | ICD-10-CM

## 2020-04-01 DIAGNOSIS — Z8249 Family history of ischemic heart disease and other diseases of the circulatory system: Secondary | ICD-10-CM

## 2020-04-01 DIAGNOSIS — E785 Hyperlipidemia, unspecified: Secondary | ICD-10-CM | POA: Diagnosis not present

## 2020-04-01 MED ORDER — ATORVASTATIN CALCIUM 40 MG PO TABS: 40.0000 mg | ORAL_TABLET | Freq: Every day | ORAL | 1 refills | Status: DC

## 2020-04-01 NOTE — Progress Notes (Signed)
Subjective:  Patient ID: Ralph Hayes, male    DOB: [birth year]  Age: 64 y.o. MRN: [id number]  CC:  Chief Complaint  Patient presents with  . Annual Exam    HPI [name] presents for   Annual exam  Hyperlipidemia: Takes 20mg  lipitor qd - past year (splits 40mg  in half).  Normal CPK in November 2019. Cramping in arm with repetitive activity, not sure if this may be better on 20mg  dose, but still has rarely with activity. ?body odor on statin.  Less activity past year.  Wt Readings from Last 3 Encounters:  04/01/20 231 lb (104.8 kg)  12/18/18 228 lb (103.4 kg)  09/01/18 234 lb 6.4 oz (106.3 kg)   Lab Results  Component Value Date   CHOL 222 (H) 03/29/2020   HDL 55 03/29/2020   LDLCALC 131 (H) 03/29/2020   TRIG 201 (H) 03/29/2020   CHOLHDL 4.0 03/29/2020   Lab Results  Component Value Date   ALT 43 03/29/2020   AST 23 03/29/2020   ALKPHOS 97 03/29/2020   BILITOT 1.3 (H) 28/36/6294   Umbilical hernia repair in February 2020.  Dr. Lucia Gaskins  Father had cerebral aneurysm, fatal at 50yo, as well as maternal aunt.  No personal headaches.   Cancer screening: Colonoscopy 05/12/2018, 5-year follow-up.   Prostate: agreed to testing after r/b of testing discussed. DRE today with recent PSA slightly higher.  Urinating ok overall. Rarely decreased stream, or urgency.  Rare nocturia.  Lab Results  Component Value Date   PSA1 3.8 03/29/2020   PSA1 3.4 09/01/2018   PSA1 2.9 09/12/2017   PSA 2.52 03/08/2016   PSA 2.56 04/07/2015   PSA 2.63 09/21/2014   Immunization History  Administered Date(s) Administered  . Influenza Split 08/05/2012  . Influenza,inj,Quad PF,6+ Mos 09/21/2014, 10/08/2016, 09/12/2017  . Tdap 11/25/2007, 02/27/2018  . Varicella 11/25/2007  . Zoster 05/06/2014  covid vaccine in April.   No exam data present Followed by ophthalmology, Dr. Gilford Rile, monitoring pressure, watching for macular degeneration. No concerns.  Dr. Cordelia Pen.   Dental: every 6  months.   Exercise: garden work, home projects. Less exercise with pandemic.   Hyperglycemia: Glucose 103 on recent labs.  A1c was normal.  History Patient Active Problem List   Diagnosis Date Noted  . Cervical disc disorder with radiculopathy of cervical region 11/17/2014  . 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 Bradycardia 05/04/2014   Past Medical History:  Diagnosis Date  . Hyperlipidemia   . Medical history non-contributory   . Umbilical hernia    Past Surgical History:  Procedure Laterality Date  . COLONOSCOPY    . EYE SURGERY N/A    [employer]reesia 03/29/2020  . HERNIA REPAIR N/A    [employer]reesia 03/29/2020  . NO PAST SURGERIES    . UMBILICAL HERNIA REPAIR N/A 12/28/2016   Procedure: LAPAROSCOPIC UMBILICAL HERNIA;  Surgeon: Alphonsa Overall, MD;  Location: [facility];  Service: General;  Laterality: N/A;  . UMBILICAL HERNIA REPAIR N/A 12/18/2018   Procedure: LAPAROSCOPIC EXPLORATION  AND REPAIR OF UMBILICAL HERNIA ERAS PATHWAY;  Surgeon: Alphonsa Overall, MD;  Location: [facility];  Service: General;  Laterality: N/A;   Allergies  Allergen Reactions  . Penicillins Swelling    SWELLING REACTION UNSPECIFIED   Has patient had a PCN reaction causing immediate rash, facial/tongue/throat swelling, SOB or lightheadedness with hypotension:unsure Has patient had a PCN reaction causing severe rash involving mucus membranes or skin necrosis:unsure Has patient had a PCN reaction that required hospitalization:No Has  patient had a PCN reaction occurring within the last 10 years:unsure If all of the above answers are "NO", then may proceed with Cephalosporin use. Childhood reaction,   Prior to Admission medications   Medication Sig Start Date End Date Taking? Authorizing Provider  atorvastatin (LIPITOR) 80 MG tablet Take 40 mg by mouth daily.   Yes [provider]  co-enzyme Q-10 50 MG capsule Take 300 mg by mouth daily.   Yes [provider]  aspirin EC 81 MG tablet Take 81 mg  by mouth once a week.     [provider]   Social History   Socioeconomic History  . Marital status: Married    Spouse name: Not on file  . Number of children: Not on file  . Years of education: Not on file  . Highest education level: Not on file  Occupational History  . Not on file  Tobacco Use  . Smoking status: Never Smoker  . Smokeless tobacco: Never Used  Substance and Sexual Activity  . Alcohol use: Yes    Alcohol/week: 2.0 standard drinks    Types: 2 Glasses of wine per week    Comment: twice a wk  . Drug use: No  . Sexual activity: Yes  Other Topics Concern  . Not on file  Social History Narrative   Married. Education: [employer].    Social Determinants of Health   Financial Resource Strain:   . Difficulty of Paying Living Expenses:   Food Insecurity:   . Worried About Charity fundraiser in the Last Year:   . Arboriculturist in the Last Year:   Transportation Needs:   . Film/video editor (Medical):   Marland Kitchen Lack of Transportation (Non-Medical):   Physical Activity:   . Days of Exercise per Week:   . Minutes of Exercise per Session:   Stress:   . Feeling of Stress :   Social Connections:   . Frequency of Communication with Friends and Family:   . Frequency of Social Gatherings with Friends and Family:   . Attends Religious Services:   . Active Member of Clubs or Organizations:   . Attends Archivist Meetings:   Marland Kitchen Marital Status:   Intimate Partner Violence:   . Fear of Current or Ex-Partner:   . Emotionally Abused:   Marland Kitchen Physically Abused:   . Sexually Abused:     Review of Systems 13 point review of systems per patient health survey noted.  Negative other than as indicated above or in HPI.    Objective:   Vitals:   04/01/20 1321  BP: 126/82  Pulse: 60  Temp: 97.6 F (36.4 C)  TempSrc: Temporal  SpO2: 97%  Weight: 231 lb (104.8 kg)  Height: 6' (1.829 m)     Physical Exam Vitals reviewed.  Constitutional:       Appearance: He is well-developed.  HENT:     Head: Normocephalic and atraumatic.     Right Ear: External ear normal.     Left Ear: External ear normal.  Eyes:     Conjunctiva/sclera: Conjunctivae normal.     Pupils: Pupils are equal, round, and reactive to light.  Neck:     Thyroid: No thyromegaly.  Cardiovascular:     Rate and Rhythm: Normal rate and regular rhythm.     Heart sounds: Normal heart sounds.  Pulmonary:     Effort: Pulmonary effort is normal. No respiratory distress.     Breath sounds: Normal breath  sounds. No wheezing.  Abdominal:     General: There is no distension.     Palpations: Abdomen is soft.     Tenderness: There is no abdominal tenderness.     Hernia: There is no hernia in the left inguinal area or right inguinal area.  Genitourinary:    Comments: Firm prostate, slightly enlarged but no apparent focal nodules appreciated. Musculoskeletal:        General: No tenderness. Normal range of motion.     Cervical back: Normal range of motion and neck supple.  Lymphadenopathy:     Cervical: No cervical adenopathy.  Skin:    General: Skin is warm and dry.  Neurological:     Mental Status: He is alert and oriented to person, place, and time.     Deep Tendon Reflexes: Reflexes are normal and symmetric.  Psychiatric:        Mood and Affect: Mood normal.        Behavior: Behavior normal.     Assessment & Plan:  BEJAMIN HACKBART is a 64 y.o. male . Annual physical exam  - -anticipatory guidance as below in AVS, screening labs above. Health maintenance items as above in HPI discussed/recommended as applicable.   -Recent PSA was normal but slightly higher than previous readings, with slow upward trend.  Recheck levels in 6 months, but option to meet with urology sooner. Rtc sooner if symptomatic.   Family history of cerebral aneurysm - Plan: Ambulatory referral to Neurology  -Single first-degree family member, but also maternal aunt with cerebral aneurysm.  Will  refer to neurology to discuss need for testing or testing interval if that is needed.  Asymptomatic  Hyperlipidemia, unspecified hyperlipidemia type - Plan: atorvastatin (LIPITOR) 40 MG tablet  -Trial of higher dose at 40 mg Lipitor daily, potential side effects were discussed.  If recurrence of myalgias may need to return to lower dose.  Recheck 6 weeks  Meds ordered this encounter  Medications  . atorvastatin (LIPITOR) 40 MG tablet    Sig: Take 1 tablet (40 mg total) by mouth daily.    Dispense:  90 tablet    Refill:  1   Patient Instructions   Try 40mg  lipitor once per day and recheck levels in 6 weeks. If new side effects, or new side effects - let me know.   I will refer you to neurology to discuss screening for cerebral aneurysms.  PSA was normal but slightly higher than previous level on recent testing.  Possible enlarged prostate or benign prostatic hypertrophy, but would like to repeat that test in next 6 months.  If any new urinary symptoms during that time return sooner.   If there are other concerns we were unable to address today please schedule separate appointment.  Thank you for coming in.   Keeping you healthy  Get these tests  Blood pressure- Have your blood pressure checked once a year by your healthcare provider.  Normal blood pressure is 120/80  Weight- Have your body mass index (BMI) calculated to screen for obesity.  BMI is a measure of body fat based on height and weight. You can also calculate your own BMI at [email or web address].  Cholesterol- Have your cholesterol checked every year.  Diabetes- Have your blood sugar checked regularly if you have high blood pressure, high cholesterol, have a family history of diabetes or if you are overweight.  Screening for Colon Cancer- Colonoscopy starting at age 26.  Screening may begin sooner depending on your family  history and other health conditions. Follow up colonoscopy as directed by your  Gastroenterologist.  Screening for Prostate Cancer- Both blood work (PSA) and a rectal exam help screen for Prostate Cancer.  Screening begins at age 22 with African-American men and at age 107 with Caucasian men.  Screening may begin sooner depending on your family history.  Take these medicines  Aspirin- One aspirin daily can help prevent Heart disease and Stroke.  Flu shot- Every fall.  Tetanus- Every 10 years.  Zostavax- Once after the age of 26 to prevent Shingles.  Pneumonia shot- Once after the age of 13; if you are younger than 52, ask your healthcare provider if you need a Pneumonia shot.  Take these steps  Don't smoke- If you do smoke, talk to your doctor about quitting.  For tips on how to quit, go to www.smokefree.gov or call 1-800-QUIT-NOW.  Be physically active- Exercise 5 days a week for at least 30 minutes.  If you are not already physically active start slow and gradually work up to 30 minutes of moderate physical activity.  Examples of moderate activity include walking briskly, mowing the yard, dancing, swimming, bicycling, etc.  Eat a healthy diet- Eat a variety of healthy food such as fruits, vegetables, low fat milk, low fat cheese, yogurt, lean meant, poultry, fish, beans, tofu, etc. For more information go to www.thenutritionsource.org  Drink alcohol in moderation- Limit alcohol intake to less than two drinks a day. Never drink and drive.  Dentist- Brush and floss twice daily; visit your dentist twice a year.  Depression- Your emotional health is as important as your physical health. If you're feeling down, or losing interest in things you would normally enjoy please talk to your healthcare provider.  Eye exam- Visit your eye doctor every year.  Safe sex- If you may be exposed to a sexually transmitted infection, use a condom.  Seat belts- Seat belts can save your life; always wear one.  Smoke/Carbon Monoxide detectors- These detectors need to be installed on  the appropriate level of your home.  Replace batteries at least once a year.  Skin cancer- When out in the sun, cover up and use sunscreen 15 SPF or higher.  Violence- If anyone is threatening you, please tell your healthcare provider.  Living Will/ Health care power of attorney- Speak with your healthcare provider and family.   If you have lab work done today you will be contacted with your lab results within the next 2 weeks.  If you have not heard from [place] then please contact us. The fastest way to get your results is to register for My Chart.   IF you received an x-ray today, you will receive an invoice from [facility]. Please contact [facility] at [phone] with questions or concerns regarding your invoice.   IF you received labwork today, you will receive an invoice from [facility]. Please contact [facility] at [phone] with questions or concerns regarding your invoice.   Our billing staff will not be able to assist you with questions regarding bills from these companies.  You will be contacted with the lab results as soon as they are available. The fastest way to get your results is to activate your My Chart account. Instructions are located on the last page of this paperwork. If you have not heard from [place] regarding the results in 2 weeks, please contact this office.         Signed, [name], MD [facility]

## 2020-04-01 NOTE — Patient Instructions (Addendum)
Try 40mg  lipitor once per day and recheck levels in 6 weeks. If new side effects, or new side effects - let me know.   I will refer you to neurology to discuss screening for cerebral aneurysms.  PSA was normal but slightly higher than previous level on recent testing.  Possible enlarged prostate or benign prostatic hypertrophy, but would like to repeat that test in next 6 months.  If any new urinary symptoms during that time return sooner.   If there are other concerns we were unable to address today please schedule separate appointment.  Thank you for coming in.   Keeping you healthy  Get these tests  Blood pressure- Have your blood pressure checked once a year by your healthcare provider.  Normal blood pressure is 120/80  Weight- Have your body mass index (BMI) calculated to screen for obesity.  BMI is a measure of body fat based on height and weight. You can also calculate your own BMI at [email or web address].  Cholesterol- Have your cholesterol checked every year.  Diabetes- Have your blood sugar checked regularly if you have high blood pressure, high cholesterol, have a family history of diabetes or if you are overweight.  Screening for Colon Cancer- Colonoscopy starting at age 71.  Screening may begin sooner depending on your family history and other health conditions. Follow up colonoscopy as directed by your Gastroenterologist.  Screening for Prostate Cancer- Both blood work (PSA) and a rectal exam help screen for Prostate Cancer.  Screening begins at age 28 with African-American men and at age 53 with Caucasian men.  Screening may begin sooner depending on your family history.  Take these medicines  Aspirin- One aspirin daily can help prevent Heart disease and Stroke.  Flu shot- Every fall.  Tetanus- Every 10 years.  Zostavax- Once after the age of 42 to prevent Shingles.  Pneumonia shot- Once after the age of 17; if you are younger than 3, ask your healthcare provider  if you need a Pneumonia shot.  Take these steps  Don't smoke- If you do smoke, talk to your doctor about quitting.  For tips on how to quit, go to www.smokefree.gov or call 1-800-QUIT-NOW.  Be physically active- Exercise 5 days a week for at least 30 minutes.  If you are not already physically active start slow and gradually work up to 30 minutes of moderate physical activity.  Examples of moderate activity include walking briskly, mowing the yard, dancing, swimming, bicycling, etc.  Eat a healthy diet- Eat a variety of healthy food such as fruits, vegetables, low fat milk, low fat cheese, yogurt, lean meant, poultry, fish, beans, tofu, etc. For more information go to www.thenutritionsource.org  Drink alcohol in moderation- Limit alcohol intake to less than two drinks a day. Never drink and drive.  Dentist- Brush and floss twice daily; visit your dentist twice a year.  Depression- Your emotional health is as important as your physical health. If you're feeling down, or losing interest in things you would normally enjoy please talk to your healthcare provider.  Eye exam- Visit your eye doctor every year.  Safe sex- If you may be exposed to a sexually transmitted infection, use a condom.  Seat belts- Seat belts can save your life; always wear one.  Smoke/Carbon Monoxide detectors- These detectors need to be installed on the appropriate level of your home.  Replace batteries at least once a year.  Skin cancer- When out in the sun, cover up and use sunscreen 15 SPF or higher.  Violence- If anyone is threatening you, please tell your healthcare provider.  Living Will/ Health care power of attorney- Speak with your healthcare provider and family.   If you have lab work done today you will be contacted with your lab results within the next 2 weeks.  If you have not heard from [place] then please contact us. The fastest way to get your results is to register for My Chart.   IF you received an  x-ray today, you will receive an invoice from [facility]. Please contact [facility] Radiology at [phone] with questions or concerns regarding your invoice.   IF you received labwork today, you will receive an invoice from [facility]. Please contact [facility] at [phone] with questions or concerns regarding your invoice.   Our billing staff will not be able to assist you with questions regarding bills from these companies.  You will be contacted with the lab results as soon as they are available. The fastest way to get your results is to activate your My Chart account. Instructions are located on the last page of this paperwork. If you have not heard from [place] regarding the results in 2 weeks, please contact this office.

## 2020-04-02 ENCOUNTER — Encounter: Payer: Self-pay | Admitting: Family Medicine

## 2020-04-04 ENCOUNTER — Encounter: Payer: Self-pay | Admitting: Family Medicine

## 2020-04-04 DIAGNOSIS — N529 Male erectile dysfunction, unspecified: Secondary | ICD-10-CM

## 2020-04-04 NOTE — Telephone Encounter (Signed)
Pt forgot to ask you while he was here on the 4th pt having some issues with ED and used to get a Viagra RX a few years ago from Dr Everlene Farrier and pt would like another script states he does not use a lot or very frequent. Does pt need about as this is technically a new issue?

## 2020-04-05 MED ORDER — TADALAFIL 20 MG PO TABS: 10.0000 mg | ORAL_TABLET | ORAL | 11 refills | Status: DC | PRN

## 2020-04-05 NOTE — Telephone Encounter (Signed)
Called pt. Has taken cialis in past. No HA/face flushing, or back pain.  No chest pain with exertion.  No known cardiac disease.  Advised on potential risks and side effects of medication. Cialis. Rx given - use lowest effective dose. Side effects discussed (including but not limited to headache/flushing, blue discoloration of vision, possible vascular steal and risk of cardiac effects if underlying unknown coronary artery disease, and permanent sensorineural hearing loss). Understanding expressed.

## 2020-05-11 ENCOUNTER — Ambulatory Visit: Payer: Federal, State, Local not specified - PPO | Admitting: Family Medicine

## 2020-05-11 ENCOUNTER — Other Ambulatory Visit: Payer: Self-pay

## 2020-05-11 ENCOUNTER — Encounter: Payer: Self-pay | Admitting: Family Medicine

## 2020-05-11 VITALS — BP 110/80 | HR 56 | Temp 97.9°F | Ht 72.0 in | Wt 232.0 lb

## 2020-05-11 DIAGNOSIS — E785 Hyperlipidemia, unspecified: Secondary | ICD-10-CM | POA: Diagnosis not present

## 2020-05-11 NOTE — Progress Notes (Signed)
Subjective:  Patient ID: Ralph Hayes, male    DOB: [birth year]  Age: 64 y.o. MRN: [id number]  CC:  Chief Complaint  Patient presents with  . Follow-up    on hyperlipidemia. PT reports he has gone back to taking the full 40 mg tab as discussed at last OV. pt hasn't expireanced any physical symptoms of this conditions since lastOV. Pt has questions about last prostate exam. pt states the provider told him it felt inlarged, but the lab work came back normal.     HPI Ralph Hayes presents for   Hyperlipidemia: Lipitor increased to 40 mg daily after elevated readings last visit.  No new muscle aches. Tolerating current dose.   Lab Results  Component Value Date   CHOL 222 (H) 03/29/2020   HDL 55 03/29/2020   LDLCALC 131 (H) 03/29/2020   TRIG 201 (H) 03/29/2020   CHOLHDL 4.0 03/29/2020   Lab Results  Component Value Date   ALT 43 03/29/2020   AST 23 03/29/2020   ALKPHOS 97 03/29/2020   BILITOT 1.3 (H) 03/29/2020   Suspected BPH: Firm, slightly enlarged prostate on exam at his physical June 4.  No discrete nodules were appreciated.  PSA level was normal but slightly higher than previous reading, 24-month repeat testing recommended.  PSA 2.9 in November 2018, 3.4 in November 2019, 3.8 in June of this year.  Nocturia once per night. Stream ok. No new concerns. Deferred urology eval at this time.   Erectile dysfunction Cialis prescribed last month, did not need to take.   History Patient Active Problem List   Diagnosis Date Noted  . Cervical disc disorder with radiculopathy of cervical region 11/17/2014  . 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 Bradycardia 05/04/2014   Past Medical History:  Diagnosis Date  . Hyperlipidemia   . Medical history non-contributory   . Umbilical hernia    Past Surgical History:  Procedure Laterality Date  . COLONOSCOPY    . EYE SURGERY N/A    [employer]reesia 03/29/2020  . HERNIA REPAIR N/A    [employer]reesia 03/29/2020  . NO PAST SURGERIES     . UMBILICAL HERNIA REPAIR N/A 12/28/2016   Procedure: LAPAROSCOPIC UMBILICAL HERNIA;  Surgeon: Alphonsa Overall, MD;  Location: [facility];  Service: General;  Laterality: N/A;  . UMBILICAL HERNIA REPAIR N/A 12/18/2018   Procedure: LAPAROSCOPIC EXPLORATION  AND REPAIR OF UMBILICAL HERNIA ERAS PATHWAY;  Surgeon: Alphonsa Overall, MD;  Location: [facility];  Service: General;  Laterality: N/A;   Allergies  Allergen Reactions  . Penicillins Swelling    SWELLING REACTION UNSPECIFIED   Has patient had a PCN reaction causing immediate rash, facial/tongue/throat swelling, SOB or lightheadedness with hypotension:unsure Has patient had a PCN reaction causing severe rash involving mucus membranes or skin necrosis:unsure Has patient had a PCN reaction that required hospitalization:No Has patient had a PCN reaction occurring within the last 10 years:unsure If all of the above answers are "NO", then may proceed with Cephalosporin use. Childhood reaction,   Prior to Admission medications   Medication Sig Start Date End Date Taking? Authorizing Provider  aspirin EC 81 MG tablet Take 81 mg by mouth once a week.    Yes [provider]  atorvastatin (LIPITOR) 40 MG tablet Take 1 tablet (40 mg total) by mouth daily. 04/01/20  Yes [name], MD  co-enzyme Q-10 50 MG capsule Take 300 mg by mouth daily.   Yes [provider]  tadalafil (CIALIS) 20 MG tablet Take 0.5-1  tablets (10-20 mg total) by mouth every other day as needed for erectile dysfunction. 04/05/20  Yes [name], MD   Social History   Socioeconomic History  . Marital status: Married    Spouse name: Not on file  . Number of children: Not on file  . Years of education: Not on file  . Highest education level: Not on file  Occupational History  . Not on file  Tobacco Use  . Smoking status: Never Smoker  . Smokeless tobacco: Never Used  Vaping Use  . Vaping Use: Never used  Substance and Sexual Activity  . Alcohol use: Yes      Alcohol/week: 2.0 standard drinks    Types: 2 Glasses of wine per week    Comment: twice a wk  . Drug use: No  . Sexual activity: Yes  Other Topics Concern  . Not on file  Social History Narrative   Married. Education: [employer].    Social Determinants of Health   Financial Resource Strain:   . Difficulty of Paying Living Expenses:   Food Insecurity:   . Worried About Charity fundraiser in the Last Year:   . Arboriculturist in the Last Year:   Transportation Needs:   . Film/video editor (Medical):   Marland Kitchen Lack of Transportation (Non-Medical):   Physical Activity:   . Days of Exercise per Week:   . Minutes of Exercise per Session:   Stress:   . Feeling of Stress :   Social Connections:   . Frequency of Communication with Friends and Family:   . Frequency of Social Gatherings with Friends and Family:   . Attends Religious Services:   . Active Member of Clubs or Organizations:   . Attends Archivist Meetings:   Marland Kitchen Marital Status:   Intimate Partner Violence:   . Fear of Current or Ex-Partner:   . Emotionally Abused:   Marland Kitchen Physically Abused:   . Sexually Abused:     Review of Systems  Per hpi  Objective:   Vitals:   05/11/20 0838 05/11/20 0843  BP: (!) 142/88 110/80  Pulse: (!) 56   Temp: 97.9 F (36.6 C)   TempSrc: Temporal   SpO2: 97%   Weight: 232 lb (105.2 kg)   Height: 6' (1.829 m)      Physical Exam Vitals reviewed.  Constitutional:      General: He is not in acute distress.    Appearance: He is well-developed.  HENT:     Head: Normocephalic and atraumatic.  Cardiovascular:     Rate and Rhythm: Normal rate and regular rhythm.  Pulmonary:     Effort: Pulmonary effort is normal.     Breath sounds: Normal breath sounds.  Abdominal:     Tenderness: There is no abdominal tenderness.  Neurological:     Mental Status: He is alert and oriented to person, place, and time.  Psychiatric:        Mood and Affect: Mood normal.        Behavior:  Behavior normal.        Assessment & Plan:  Ralph Hayes is a 64 y.o. male . Hyperlipidemia, unspecified hyperlipidemia type - Plan: Lipid panel, Hepatic Function Panel  -Tolerating higher dose of Lipitor, continue same.  Check LFTs, lipid panel.  Plan for recheck prostate exam in 5 months, but discussed option to meet with urology for second exam.  Suspected BPH.  Declined referral at this time.  RTC  precautions given  No orders of the defined types were placed in this encounter.  Patient Instructions    Thanks for coming in today.  I will check the liver test and cholesterol test on the higher dose of medication.  No changes for now.  Follow-up in 5 months, sooner if any new urinary symptoms.  As we discussed I am happy to refer you to urologist to repeat prostate exam if you would like, but will repeat that testing in 5 months as well.   If you have lab work done today you will be contacted with your lab results within the next 2 weeks.  If you have not heard from [place] then please contact us. The fastest way to get your results is to register for My Chart.   IF you received an x-ray today, you will receive an invoice from [facility]. Please contact [facility]iology at [phone] with questions or concerns regarding your invoice.   IF you received labwork today, you will receive an invoice from [facility]. Please contact [facility] at [phone] with questions or concerns regarding your invoice.   Our billing staff will not be able to assist you with questions regarding bills from these companies.  You will be contacted with the lab results as soon as they are available. The fastest way to get your results is to activate your My Chart account. Instructions are located on the last page of this paperwork. If you have not heard from [place] regarding the results in 2 weeks, please contact this office.         Signed, [name], MD [facility]

## 2020-05-11 NOTE — Patient Instructions (Addendum)
°  Thanks for coming in today.  I will check the liver test and cholesterol test on the higher dose of medication.  No changes for now.  Follow-up in 5 months, sooner if any new urinary symptoms.  As we discussed I am happy to refer you to urologist to repeat prostate exam if you would like, but will repeat that testing in 5 months as well.   If you have lab work done today you will be contacted with your lab results within the next 2 weeks.  If you have not heard from [place] then please contact us. The fastest way to get your results is to register for My Chart.   IF you received an x-ray today, you will receive an invoice from [facility]iology. Please contact [facility] at [phone] with questions or concerns regarding your invoice.   IF you received labwork today, you will receive an invoice from [facility]. Please contact [facility] at [phone] with questions or concerns regarding your invoice.   Our billing staff will not be able to assist you with questions regarding bills from these companies.  You will be contacted with the lab results as soon as they are available. The fastest way to get your results is to activate your My Chart account. Instructions are located on the last page of this paperwork. If you have not heard from [place] regarding the results in 2 weeks, please contact this office.

## 2020-05-12 LAB — HEPATIC FUNCTION PANEL
ALT: 57 IU/L — ABNORMAL HIGH (ref 0–44)
AST: 31 IU/L (ref 0–40)
Albumin: 4.4 g/dL (ref 3.8–4.8)
Alkaline Phosphatase: 98 IU/L (ref 48–121)
Bilirubin Total: 1.2 mg/dL (ref 0.0–1.2)
Bilirubin, Direct: 0.4 mg/dL (ref 0.00–0.40)
Total Protein: 6.5 g/dL (ref 6.0–8.5)

## 2020-05-12 LAB — LIPID PANEL
Chol/HDL Ratio: 4 ratio (ref 0.0–5.0)
Cholesterol, Total: 190 mg/dL (ref 100–199)
HDL: 47 mg/dL (ref >39)
LDL Chol Calc (NIH): 118 mg/dL — ABNORMAL HIGH (ref 0–99)
Triglycerides: 143 mg/dL (ref 0–149)
VLDL Cholesterol Cal: 25 mg/dL (ref 5–40)

## 2020-05-13 ENCOUNTER — Ambulatory Visit: Payer: Federal, State, Local not specified - PPO | Admitting: Family Medicine

## 2020-05-25 ENCOUNTER — Ambulatory Visit: Payer: Federal, State, Local not specified - PPO | Admitting: Neurology

## 2020-05-25 ENCOUNTER — Encounter: Payer: Self-pay | Admitting: Neurology

## 2020-05-25 VITALS — BP 146/89 | HR 62 | Ht 72.0 in | Wt 235.0 lb

## 2020-05-25 DIAGNOSIS — I671 Cerebral aneurysm, nonruptured: Secondary | ICD-10-CM

## 2020-05-25 NOTE — Patient Instructions (Signed)
I had a long discussion with the patient regarding his family history of ruptured intracranial aneurysms and his risk for developing the same and answered questions.  We discussed risk factors for cerebral aneurysm development, rupture and available treatment strategies and answered questions.  I recommend he undergo CT angiogram of the brain for surveillance and if aneurysm is found consider close observation versus referral to neurosurgery/neuro intervention radiology to discuss treatment options.  I advised him to go on a low-salt diet and to lose weight and to keep close watch on his blood pressure and to not smoke.  He will return for follow-up in the future only as necessary.  Cerebral Aneurysm  A cerebral aneurysm is a bulge that occurs in the blood vessel inside the brain. An aneurysm is caused when a weakened part of the blood vessel expands. The blood vessel expands due to the constant pressure from the flow of blood through the weakened blood vessel. As the weakened aneurysm expands, the walls of the aneurysm become weaker. Aneurysms are dangerous because they can leak or burst (rupture). When a cerebral aneurysm ruptures, it causes bleeding in the brain (subarachnoid hemorrhage). The blood flow to the area of the brain supplied by the artery is also reduced. This can cause stroke, seizures, or coma. A ruptured cerebral aneurysm is a medical emergency. This can cause permanent brain damage or death. What are the causes? The exact cause of this condition is not known. What increases the risk? This condition is more likely to develop in people who:  Are older. The condition is most common in people between the ages of 40-60.  Are male  Have a family history of aneurysm in two or more direct relatives.  Have certain conditions that are passed along from parent to child (inherited). They include: ? Autosomal dominant polycystic kidney disease. This is a condition in which small,  fluid-filled sacs (cysts) develop in the kidney. ? Neurofibromatosis type 1. In this condition, flat spots develop under the skin (pigmentation) and tumors grow along nerves in the skin, brain, and other parts of the body. ? Ehlers-Danlos syndrome. This is a condition in which bad connective tissue causes loose or unstable joints and creates a very soft skin that bruises or tears easily.  Smoke.  Have high blood pressure (hypertension).  Abuse alcohol. What are the signs or symptoms? The signs and symptoms of a cerebral aneurysm that has not leaked or ruptured can depend on its size and rate of growth. A small, unchanging aneurysm generally does not cause symptoms. A larger aneurysm that is steadily growing can increase pressure on the brain or nerves.  The increased pressure from a cerebral aneurysm that has not leaked or ruptured can cause:  A headache.  Vision problems.  Numbness or weakness in an arm or leg.  Memory problems.  Problems speaking.  Seizures. If an aneurysm leaks or ruptures, it can cause a life-threatening condition, such as stroke. Symptoms may include:  A sudden, severe headache with no known cause. The headache is often described as the worst headache ever experienced.  Stiff neck.  Nausea or vomiting, especially when combined with other symptoms, such as a headache.  Sudden weakness or numbness of the face, arm, or leg, especially on one side of the body.  Sudden trouble walking or difficulty moving the arms or legs.  Double vision.  Sudden trouble seeing in one or both eyes.  Trouble speaking or understanding speech (aphasia).  Trouble swallowing.  Dizziness.  Loss of balance or coordination.  Intolerance to light.  Sudden confusion or loss of consciousness. How is this diagnosed? This condition is diagnosed using certain tests, including:  CT scan.  Computed tomographic angiogram (CTA). This test uses a dye and a scanner to produce  images of your blood vessels.  Magnetic resonance angiogram (MRA). This test uses an MRI machine to produce images of your blood vessels.  Digital subtraction angiogram (DSA). This test involves placing a flexible, thin tube (catheter) into the artery in your thigh and guiding it up to the arteries in the brain. A dye is then injected into the area and X-rays are taken to create images of your blood vessels. How is this treated? Unruptured aneurysm Treatment is complex when an aneurysm is found and it is not causing problems. Treatment is individualized, as each case is different. Many factors must be considered, such as the size and exact location of your aneurysm, your age, your overall health, and your preferences. Small aneurysms in certain locations of the brain have a very low chance of bleeding or rupturing. These small aneurysms may not be treated.  In some cases, however, treatment may be required. Treatment depends on the size and location of the aneurysm. They may include:  Coiling. During this procedure, a catheter is inserted and advanced through a blood vessel. Once the catheter reaches the aneurysm, tiny coils are used to block blood flow into the aneurysm. This procedure is sometimes done at the same time as a DSA.  Surgical clipping. During surgery, a clip is placed at the base of the aneurysm. The clip prevents blood from continuing to enter the aneurysm.  Flow diversion. This procedure is used to divert blood flow around the aneurysm with a stent that is placed across the opening of an aneurysm. Ruptured aneurysm Immediate emergency surgery or coiling may be needed to help prevent damage to the brain and to reduce the risk of rebleeding. The timing of treatment is an important factor in preventing complications. Successful early treatment of a ruptured aneurysm within the first 3 days of a bleed helps to prevent rebleeding and blood vessel spasm. In some cases, there may be a  reason to treat 10-14 days after a rupture. Many factors are considered when making this decision, and each case is handled individually. Follow these instructions at home: If your aneurysm is not treated:  Take over-the-counter and prescription medicines only as told by your health care provider.  Follow a diet suggested by your health care provider. Certain dietary changes may be advised to address high blood pressure (hypertension), such as choosing foods that are low in salt (sodium), saturated fat, trans fat, and cholesterol.  Stay physically active. It is recommended that you get at least 30 minutes of activity on most or all days.  Do not use any products that contain nicotine or tobacco, such as cigarettes and e-cigarettes. If you need help quitting, ask your health care provider.  Limit alcohol intake to no more than 1 drink a day for nonpregnant women and 2 drinks a day for men. One drink equals 12 oz of beer, 5 oz of wine, or 1 oz of hard liquor.  Do not use street drugs. If you need help quitting, ask your health care provider.  Keep all follow-up visits as told by your health care provider. This is important. This includes any referrals, imaging studies, and laboratory tests. Proper follow-up may prevent an aneurysm rupture or a stroke. Get help  right away if:  You have a sudden, severe headache with no known cause. This may include a stiff neck.  You have sudden nausea or vomiting with a severe headache.  You have a seizure.  You have other symptoms of stroke. The acronym BEFAST is an easy way to remember the main warning signs of stroke. ? B = Balance problems. Signs include dizziness, sudden trouble walking, or loss of balance. ? E = Eye problems. This includes trouble seeing or a sudden change in vision. ? F = Face changes. This includes sudden weakness or numbness of the face, or the face or eyelid drooping to one side. ? A = Arm weakness or numbness. This happens  suddenly and usually on one side of the body. ? S = Speech problems. This includes trouble speaking or trouble understanding. ? T = Time. Time to call 911 or seek emergency care. Do not wait to see if symptoms will go away. Make note of the time your symptoms started. These symptoms may represent a serious problem that is an emergency. Do not wait to see if the symptoms will go away. Get medical help right away. Call your local emergency services (911 in the [place] Do not drive yourself to the hospital. Summary  An aneurysm is a bulge in an artery.  Aneurysms are dangerous because they can leak or burst (rupture). When a cerebral aneurysm ruptures, it causes bleeding in the brain.  Treatment depends on whether the aneurysm is ruptured. A ruptured aneurysm is a medical emergency.  Get help right away if you have symptoms of stroke. The acronym BEFAST is an easy way to remember the main warning signs of stroke. This information is not intended to replace advice given to you by your health care provider. Make sure you discuss any questions you have with your health care provider. Document Revised: 07/04/2018 Document Reviewed: 11/22/2016 Elsevier Patient Education  [place].

## 2020-05-25 NOTE — Progress Notes (Signed)
[facility] [address] [place]. [place] [address] [phone]       OFFICE CONSULT NOTE  [name] Date of Birth:  [birth year] Medical Record Number:  [id number]   Referring MD: [name]  Reason for Referral: Family history of brain aneurysm  HPI: Ralph Hayes is a pleasant 64 year old Caucasian male seen today for initial office consultation visit.  History is obtained from the patient and review of electronic medical records.  No pertinent imaging films available in PACS for review.  He has no significant past medical history except mild hyperlipidemia.  He is here to see me because of his strong family history of ruptured intracranial aneurysms.  His father as well as father sister both died in their early 43s with ruptured brain aneurysms.  Patient has a half brother and sister who have not had any known history of aneurysm so far.  Patient is concerned about his own risk.  He denies any history of headaches, blurred vision, double vision, focal neurological symptoms.  He has no prior history of strokes TIAs seizures or significant neurological problems.  He has been taking aspirin 80 mcg daily more for primary cardiac and stroke prevention and also Lipitor which is tolerating well.  He has no known history of hypertension and does not smoke cigarettes.  His blood pressure however is borderline today in office at 146/89.  There is no family history of connective tissue disorder like fibromuscular dysplasia, Ehler Danlos syndrome.  Is still working full-time.  ROS:   14 system review of systems is positive for no complaints today.  All systems negative PMH:  Past Medical History:  Diagnosis Date  . Hyperlipidemia   . Medical history non-contributory   . Umbilical hernia     Social History:  Social History   Socioeconomic History  . Marital status: Married    Spouse name: Not on file  . Number of children: Not on file  . Years of education: Not  on file  . Highest education level: Not on file  Occupational History  . Not on file  Tobacco Use  . Smoking status: Never Smoker  . Smokeless tobacco: Never Used  Vaping Use  . Vaping Use: Never used  Substance and Sexual Activity  . Alcohol use: Yes    Alcohol/week: 2.0 standard drinks    Types: 2 Glasses of wine per week    Comment: twice a wk  . Drug use: No  . Sexual activity: Yes  Other Topics Concern  . Not on file  Social History Narrative   Married. Education: [employer].    Social Determinants of Health   Financial Resource Strain:   . Difficulty of Paying Living Expenses:   Food Insecurity:   . Worried About Charity fundraiser in the Last Year:   . Arboriculturist in the Last Year:   Transportation Needs:   . Film/video editor (Medical):   Marland Kitchen Lack of Transportation (Non-Medical):   Physical Activity:   . Days of Exercise per Week:   . Minutes of Exercise per Session:   Stress:   . Feeling of Stress :   Social Connections:   . Frequency of Communication with Friends and Family:   . Frequency of Social Gatherings with Friends and Family:   . Attends Religious Services:   . Active Member of Clubs or Organizations:   . Attends Archivist Meetings:   Marland Kitchen Marital Status:   Intimate Partner Violence:   .  Fear of Current or Ex-Partner:   . Emotionally Abused:   Marland Kitchen Physically Abused:   . Sexually Abused:     Medications:   Current Outpatient Medications on File Prior to Visit  Medication Sig Dispense Refill  . aspirin EC 81 MG tablet Take 81 mg by mouth once a week.     Marland Kitchen atorvastatin (LIPITOR) 40 MG tablet Take 1 tablet (40 mg total) by mouth daily. 90 tablet 1  . co-enzyme Q-10 50 MG capsule Take 300 mg by mouth daily.    . tadalafil (CIALIS) 20 MG tablet Take 0.5-1 tablets (10-20 mg total) by mouth every other day as needed for erectile dysfunction. 5 tablet 11   No current facility-administered medications on file prior to visit.     Allergies:   Allergies  Allergen Reactions  . Penicillins Swelling    SWELLING REACTION UNSPECIFIED   Has patient had a PCN reaction causing immediate rash, facial/tongue/throat swelling, SOB or lightheadedness with hypotension:unsure Has patient had a PCN reaction causing severe rash involving mucus membranes or skin necrosis:unsure Has patient had a PCN reaction that required hospitalization:No Has patient had a PCN reaction occurring within the last 10 years:unsure If all of the above answers are "NO", then may proceed with Cephalosporin use. Childhood reaction,    Physical Exam General: well developed, well nourished middle-aged Caucasian male, seated, in no evident distress Head: head normocephalic and atraumatic.   Neck: supple with no carotid or supraclavicular bruits Cardiovascular: regular rate and rhythm, no murmurs Musculoskeletal: no deformity Skin:  no rash/petichiae Vascular:  Normal pulses all extremities  Neurologic Exam Mental Status: Awake and fully alert. Oriented to place and time. Recent and remote memory intact. Attention span, concentration and fund of knowledge appropriate. Mood and affect appropriate.  Cranial Nerves: Fundoscopic exam reveals sharp disc margins. Pupils equal, briskly reactive to light. Extraocular movements full without nystagmus. Visual fields full to confrontation. Hearing intact. Facial sensation intact. Face, tongue, palate moves normally and symmetrically.  Motor: Normal bulk and tone. Normal strength in all tested extremity muscles. Sensory.: intact to touch , pinprick , position and vibratory sensation.  Coordination: Rapid alternating movements normal in all extremities. Finger-to-nose and heel-to-shin performed accurately bilaterally. Gait and Station: Arises from chair without difficulty. Stance is normal. Gait demonstrates normal stride length and balance . Able to heel, toe and tandem walk without difficulty.  Reflexes: 1+ and  symmetric. Toes downgoing.       ASSESSMENT: 64 year old Caucasian male with strong family history of ruptured intracranial aneurysms concerns about his risk of developing it.  No history of prior neurological symptoms.     PLAN: I had a long discussion with the patient regarding his family history of ruptured intracranial aneurysms and his risk for developing the same and answered questions.  We discussed risk factors for cerebral aneurysm development, rupture and available treatment strategies and answered questions.  I recommend he undergo CT angiogram of the brain for surveillance and if aneurysm is found consider close observation versus referral to neurosurgery/neuro intervention radiology to discuss treatment options.  I advised him to go on a low-salt diet and to lose weight and to keep close watch on his blood pressure and to not smoke.  Greater than 50% time during this 45-minute consultation visit was spent on counseling and coordination of care about his family history of aneurysms and discussion about aneurysm risk, surveillance and treatment and answering questions he will return for follow-up in the future only as necessary. Devan Danzer  Leonie Man, [facility] [address] [place], [place] [address]  Phone [phone] Fax [phone] Note: This document was prepared with digital dictation and possible smart phrase technology. Any transcriptional errors that result from this process are unintentional.

## 2020-05-26 ENCOUNTER — Telehealth: Payer: Self-pay | Admitting: Neurology

## 2020-05-26 NOTE — Telephone Encounter (Signed)
05/26/20 [employer] fed no auth order sent to [facility]  05/25/20 1st attempt left vm/epic order/ab

## 2020-06-17 DIAGNOSIS — Z9889 Other specified postprocedural states: Secondary | ICD-10-CM | POA: Diagnosis not present

## 2020-06-17 DIAGNOSIS — H35362 Drusen (degenerative) of macula, left eye: Secondary | ICD-10-CM | POA: Diagnosis not present

## 2020-06-17 DIAGNOSIS — H2512 Age-related nuclear cataract, left eye: Secondary | ICD-10-CM | POA: Diagnosis not present

## 2020-07-28 DIAGNOSIS — L578 Other skin changes due to chronic exposure to nonionizing radiation: Secondary | ICD-10-CM | POA: Diagnosis not present

## 2020-07-28 DIAGNOSIS — D225 Melanocytic nevi of trunk: Secondary | ICD-10-CM | POA: Diagnosis not present

## 2020-07-28 DIAGNOSIS — L821 Other seborrheic keratosis: Secondary | ICD-10-CM | POA: Diagnosis not present

## 2020-07-28 DIAGNOSIS — D2230 Melanocytic nevi of unspecified part of face: Secondary | ICD-10-CM | POA: Diagnosis not present

## 2020-08-11 DIAGNOSIS — Z01818 Encounter for other preprocedural examination: Secondary | ICD-10-CM | POA: Diagnosis not present

## 2020-08-11 DIAGNOSIS — H2512 Age-related nuclear cataract, left eye: Secondary | ICD-10-CM | POA: Diagnosis not present

## 2020-08-16 DIAGNOSIS — H52202 Unspecified astigmatism, left eye: Secondary | ICD-10-CM | POA: Diagnosis not present

## 2020-08-16 DIAGNOSIS — H25812 Combined forms of age-related cataract, left eye: Secondary | ICD-10-CM | POA: Diagnosis not present

## 2020-08-31 DIAGNOSIS — Z880 Allergy status to penicillin: Secondary | ICD-10-CM | POA: Diagnosis not present

## 2020-08-31 DIAGNOSIS — Z961 Presence of intraocular lens: Secondary | ICD-10-CM | POA: Diagnosis not present

## 2020-08-31 DIAGNOSIS — Z9842 Cataract extraction status, left eye: Secondary | ICD-10-CM | POA: Diagnosis not present

## 2020-08-31 DIAGNOSIS — H35362 Drusen (degenerative) of macula, left eye: Secondary | ICD-10-CM | POA: Diagnosis not present

## 2020-08-31 DIAGNOSIS — Z9889 Other specified postprocedural states: Secondary | ICD-10-CM | POA: Diagnosis not present

## 2020-08-31 DIAGNOSIS — Z48810 Encounter for surgical aftercare following surgery on the sense organs: Secondary | ICD-10-CM | POA: Diagnosis not present

## 2020-08-31 DIAGNOSIS — Z79899 Other long term (current) drug therapy: Secondary | ICD-10-CM | POA: Diagnosis not present

## 2020-09-30 ENCOUNTER — Other Ambulatory Visit: Payer: Self-pay | Admitting: Family Medicine

## 2020-09-30 DIAGNOSIS — E785 Hyperlipidemia, unspecified: Secondary | ICD-10-CM

## 2020-10-12 ENCOUNTER — Other Ambulatory Visit: Payer: Self-pay

## 2020-10-12 ENCOUNTER — Ambulatory Visit: Payer: Federal, State, Local not specified - PPO | Admitting: Family Medicine

## 2020-10-12 ENCOUNTER — Encounter: Payer: Self-pay | Admitting: Family Medicine

## 2020-10-12 VITALS — BP 128/82 | HR 51 | Temp 97.9°F | Ht 72.0 in | Wt 235.0 lb

## 2020-10-12 DIAGNOSIS — Z9889 Other specified postprocedural states: Secondary | ICD-10-CM | POA: Diagnosis not present

## 2020-10-12 DIAGNOSIS — H26492 Other secondary cataract, left eye: Secondary | ICD-10-CM | POA: Diagnosis not present

## 2020-10-12 DIAGNOSIS — E785 Hyperlipidemia, unspecified: Secondary | ICD-10-CM

## 2020-10-12 DIAGNOSIS — Z961 Presence of intraocular lens: Secondary | ICD-10-CM | POA: Diagnosis not present

## 2020-10-12 DIAGNOSIS — Z9842 Cataract extraction status, left eye: Secondary | ICD-10-CM | POA: Diagnosis not present

## 2020-10-12 DIAGNOSIS — R42 Dizziness and giddiness: Secondary | ICD-10-CM

## 2020-10-12 DIAGNOSIS — H2511 Age-related nuclear cataract, right eye: Secondary | ICD-10-CM | POA: Diagnosis not present

## 2020-10-12 LAB — CMP14+EGFR
ALT: 52 IU/L — ABNORMAL HIGH (ref 0–44)
AST: 23 IU/L (ref 0–40)
Albumin/Globulin Ratio: 2.2 (ref 1.2–2.2)
Albumin: 4.6 g/dL (ref 3.8–4.8)
Alkaline Phosphatase: 102 IU/L (ref 44–121)
BUN/Creatinine Ratio: 24 (ref 10–24)
BUN: 23 mg/dL (ref 8–27)
Bilirubin Total: 1.3 mg/dL — ABNORMAL HIGH (ref 0.0–1.2)
CO2: 25 mmol/L (ref 20–29)
Calcium: 9.3 mg/dL (ref 8.6–10.2)
Chloride: 104 mmol/L (ref 96–106)
Creatinine, Ser: 0.96 mg/dL (ref 0.76–1.27)
GFR calc Af Amer: 97 mL/min/{1.73_m2} (ref >59)
GFR calc non Af Amer: 84 mL/min/{1.73_m2} (ref >59)
Globulin, Total: 2.1 g/dL (ref 1.5–4.5)
Glucose: 104 mg/dL — ABNORMAL HIGH (ref 65–99)
Potassium: 4.3 mmol/L (ref 3.5–5.2)
Sodium: 144 mmol/L (ref 134–144)
Total Protein: 6.7 g/dL (ref 6.0–8.5)

## 2020-10-12 LAB — LIPID PANEL
Chol/HDL Ratio: 3.8 ratio (ref 0.0–5.0)
Cholesterol, Total: 219 mg/dL — ABNORMAL HIGH (ref 100–199)
HDL: 58 mg/dL (ref >39)
LDL Chol Calc (NIH): 128 mg/dL — ABNORMAL HIGH (ref 0–99)
Triglycerides: 190 mg/dL — ABNORMAL HIGH (ref 0–149)
VLDL Cholesterol Cal: 33 mg/dL (ref 5–40)

## 2020-10-12 MED ORDER — ATORVASTATIN CALCIUM 40 MG PO TABS: 40.0000 mg | ORAL_TABLET | Freq: Every day | ORAL | 3 refills | Status: DC

## 2020-10-12 NOTE — Patient Instructions (Addendum)
Vertigo Vertigo is the feeling that you or the things around you are moving when they are not. This feeling can come and go at any time. Vertigo often goes away on its own. This condition can be dangerous if it happens when you are doing activities like driving or working with machines. Your doctor will do tests to find the cause of your vertigo. These tests will also help your doctor decide on the best treatment for you. Follow these instructions at home: Eating and drinking      Drink enough fluid to keep your pee (urine) pale yellow.  Do not drink alcohol. Activity  Return to your normal activities as told by your doctor. Ask your doctor what activities are safe for you.  In the morning, first sit up on the side of the bed. When you feel okay, stand slowly while you hold onto something until you know that your balance is fine.  Move slowly. Avoid sudden body or head movements or certain positions, as told by your doctor.  Use a cane if you have trouble standing or walking.  Sit down right away if you feel dizzy.  Avoid doing any tasks or activities that can cause danger to you or others if you get dizzy.  Avoid bending down if you feel dizzy. Place items in your home so that they are easy for you to reach without leaning over.  Do not drive or use heavy machinery if you feel dizzy. General instructions  Take over-the-counter and prescription medicines only as told by your doctor.  Keep all follow-up visits as told by your doctor. This is important. Contact a doctor if:  Your medicine does not help your vertigo.  You have a fever.  Your problems get worse or you have new symptoms.  Your family or friends see changes in your behavior.  The feeling of being sick to your stomach gets worse.  Your vomiting gets worse.  You lose feeling (have numbness) in part of your body.  You feel prickling and tingling in a part of your body. Get help right away if:  You have  trouble moving or talking.  You are always dizzy.  You pass out (faint).  You get very bad headaches.  You feel weak in your hands, arms, or legs.  You have changes in your hearing.  You have changes in how you see (vision).  You get a stiff neck.  Bright light starts to bother you. Summary  Vertigo is the feeling that you or the things around you are moving when they are not.  Your doctor will do tests to find the cause of your vertigo.  You may be told to avoid some tasks, positions, or movements.  Contact a doctor if your medicine is not helping, or if you have a fever, new symptoms, or a change in behavior.  Get help right away if you get very bad headaches, or if you have changes in how you speak, hear, or see. This information is not intended to replace advice given to you by your health care provider. Make sure you discuss any questions you have with your health care provider. Document Revised: 09/08/2018 Document Reviewed: 09/08/2018 Elsevier Patient Education  2020 [place].   Preventing High Cholesterol Cholesterol is a white, waxy substance similar to fat that the human body needs to help build cells. The liver makes all the cholesterol that a person's body needs. Having high cholesterol (hypercholesterolemia) increases a person's risk for heart  disease and stroke. Extra (excess) cholesterol comes from the food the person eats. High cholesterol can often be prevented with diet and lifestyle changes. If you already have high cholesterol, you can control it with diet and lifestyle changes and with medicine. How can high cholesterol affect me? If you have high cholesterol, deposits (plaques) may build up on the walls of your arteries. The arteries are the blood vessels that carry blood away from your heart. Plaques make the arteries narrower and stiffer. This can limit or block blood flow and cause blood clots to form. Blood clots:  Are tiny balls of cells that  form in your blood.  Can move to the heart or brain, causing a heart attack or stroke. Plaques in arteries greatly increase your risk for heart attack and stroke.Making diet and lifestyle changes can reduce your risk for these conditions that may threaten your life. What can increase my risk? This condition is more likely to develop in people who:  Eat foods that are high in saturated fat or cholesterol. Saturated fat is mostly found in: ? Foods that contain animal fat, such as red meat and some dairy products. ? Certain fatty foods made from plants, such as tropical oils.  Are overweight.  Are not getting enough exercise.  Have a family history of high cholesterol. What actions can I take to prevent this? Nutrition   Eat less saturated fat.  Avoid trans fats (partially hydrogenated oils). These are often found in margarine and in some baked goods, fried foods, and snacks bought in packages.  Avoid precooked or cured meat, such as sausages or meat loaves.  Avoid foods and drinks that have added sugars.  Eat more fruits, vegetables, and whole grains.  Choose healthy sources of protein, such as fish, poultry, lean cuts of red meat, beans, peas, lentils, and nuts.  Choose healthy sources of fat, such as: ? Nuts. ? Vegetable oils, especially olive oil. ? Fish that have healthy fats (omega-3 fatty acids), such as mackerel or salmon. The items listed above may not be a complete list of recommended foods and beverages. Contact a dietitian for more information. Lifestyle  Lose weight if you are overweight. Losing 5-10 lb (2.3-4.5 kg) can help prevent or control high cholesterol. It can also lower your risk for diabetes and high blood pressure. Ask your health care provider to help you with a diet and exercise plan to lose weight safely.  Do not use any products that contain nicotine or tobacco, such as cigarettes, e-cigarettes, and chewing tobacco. If you need help quitting, ask your  health care provider.  Limit your alcohol intake. ? Do not drink alcohol if:  Your health care provider tells you not to drink.  You are pregnant, may be pregnant, or are planning to become pregnant. ? If you drink alcohol:  Limit how much you use to:  0-1 drink a day for women.  0-2 drinks a day for men.  Be aware of how much alcohol is in your drink. In the [place] one drink equals one 12 oz bottle of beer (355 mL), one 5 oz glass of wine (148 mL), or one 1 oz glass of hard liquor (44 mL). Activity   Get enough exercise. Each week, do at least 150 minutes of exercise that takes a medium level of effort (moderate-intensity exercise). ? This is exercise that:  Makes your heart beat faster and makes you breathe harder than usual.  Allows you to still be able to talk. ?  You could exercise in short sessions several times a day or longer sessions a few times a week. For example, on 5 days each week, you could walk fast or ride your bike 3 times a day for 10 minutes each time.  Do exercises as told by your health care provider. Medicines  In addition to diet and lifestyle changes, your health care provider may recommend medicines to help lower cholesterol. This may be a medicine to lower the amount of cholesterol your liver makes. You may need medicine if: ? Diet and lifestyle changes do not lower your cholesterol enough. ? You have high cholesterol and other risk factors for heart disease or stroke.  Take over-the-counter and prescription medicines only as told by your health care provider. General information  Manage your risk factors for high cholesterol. Talk with your health care provider about all your risk factors and how to lower your risk.  Manage other conditions that you have, such as diabetes or high blood pressure (hypertension).  Have blood tests to check your cholesterol levels at regular points in time as told by your health care provider.  Keep all follow-up  visits as told by your health care provider. This is important. Where to find more information  American Heart Association: www.heart.org  National Heart, Lung, and Blood Institute: https://wilson-eaton.com/ Summary  High cholesterol increases your risk for heart disease and stroke. By keeping your cholesterol level low, you can reduce your risk for these conditions.  High cholesterol can often be prevented with diet and lifestyle changes.  Work with your health care provider to manage your risk factors, and have your blood tested regularly. This information is not intended to replace advice given to you by your health care provider. Make sure you discuss any questions you have with your health care provider. Document Revised: 02/06/2019 Document Reviewed: 06/23/2016 Elsevier Patient Education  2020 [place] Maintenance, Male Adopting a healthy lifestyle and getting preventive care are important in promoting health and wellness. Ask your health care provider about:  The right schedule for you to have regular tests and exams.  Things you can do on your own to prevent diseases and keep yourself healthy. What should I know about diet, weight, and exercise? Eat a healthy diet   Eat a diet that includes plenty of vegetables, fruits, low-fat dairy products, and lean protein.  Do not eat a lot of foods that are high in solid fats, added sugars, or sodium. Maintain a healthy weight Body mass index (BMI) is a measurement that can be used to identify possible weight problems. It estimates body fat based on height and weight. Your health care provider can help determine your BMI and help you achieve or maintain a healthy weight. Get regular exercise Get regular exercise. This is one of the most important things you can do for your health. Most adults should:  Exercise for at least 150 minutes each week. The exercise should increase your heart rate and make you sweat (moderate-intensity  exercise).  Do strengthening exercises at least twice a week. This is in addition to the moderate-intensity exercise.  Spend less time sitting. Even light physical activity can be beneficial. Watch cholesterol and blood lipids Have your blood tested for lipids and cholesterol at 63 years of age, then have this test every 5 years. You may need to have your cholesterol levels checked more often if:  Your lipid or cholesterol levels are high.  You are older than 63 years of age.  You are at high risk for heart disease. What should I know about cancer screening? Many types of cancers can be detected early and may often be prevented. Depending on your health history and family history, you may need to have cancer screening at various ages. This may include screening for:  Colorectal cancer.  Prostate cancer.  Skin cancer.  Lung cancer. What should I know about heart disease, diabetes, and high blood pressure? Blood pressure and heart disease  High blood pressure causes heart disease and increases the risk of stroke. This is more likely to develop in people who have high blood pressure readings, are of African descent, or are overweight.  Talk with your health care provider about your target blood pressure readings.  Have your blood pressure checked: ? Every 3-5 years if you are 24-48 years of age. ? Every year if you are 11 years old or older.  If you are between the ages of 44 and 35 and are a current or former smoker, ask your health care provider if you should have a one-time screening for abdominal aortic aneurysm (AAA). Diabetes Have regular diabetes screenings. This checks your fasting blood sugar level. Have the screening done:  Once every three years after age 52 if you are at a normal weight and have a low risk for diabetes.  More often and at a younger age if you are overweight or have a high risk for diabetes. What should I know about preventing infection? Hepatitis  B If you have a higher risk for hepatitis B, you should be screened for this virus. Talk with your health care provider to find out if you are at risk for hepatitis B infection. Hepatitis C Blood testing is recommended for:  Everyone born from [birth year] through 1964.  Anyone with known risk factors for hepatitis C. Sexually transmitted infections (STIs)  You should be screened each year for STIs, including gonorrhea and chlamydia, if: ? You are sexually active and are younger than 63 years of age. ? You are older than 63 years of age and your health care provider tells you that you are at risk for this type of infection. ? Your sexual activity has changed since you were last screened, and you are at increased risk for chlamydia or gonorrhea. Ask your health care provider if you are at risk.  Ask your health care provider about whether you are at high risk for HIV. Your health care provider may recommend a prescription medicine to help prevent HIV infection. If you choose to take medicine to prevent HIV, you should first get tested for HIV. You should then be tested every 3 months for as long as you are taking the medicine. Follow these instructions at home: Lifestyle  Do not use any products that contain nicotine or tobacco, such as cigarettes, e-cigarettes, and chewing tobacco. If you need help quitting, ask your health care provider.  Do not use street drugs.  Do not share needles.  Ask your health care provider for help if you need support or information about quitting drugs. Alcohol use  Do not drink alcohol if your health care provider tells you not to drink.  If you drink alcohol: ? Limit how much you have to 0-2 drinks a day. ? Be aware of how much alcohol is in your drink. In the [place] one drink equals one 12 oz bottle of beer (355 mL), one 5 oz glass of wine (148 mL), or one 1 oz glass of hard liquor (  44 mL). General instructions  Schedule regular health, dental, and eye  exams.  Stay current with your vaccines.  Tell your health care provider if: ? You often feel depressed. ? You have ever been abused or do not feel safe at home. Summary  Adopting a healthy lifestyle and getting preventive care are important in promoting health and wellness.  Follow your health care provider's instructions about healthy diet, exercising, and getting tested or screened for diseases.  Follow your health care provider's instructions on monitoring your cholesterol and blood pressure. This information is not intended to replace advice given to you by your health care provider. Make sure you discuss any questions you have with your health care provider. Document Revised: 10/08/2018 Document Reviewed: 10/08/2018 Elsevier Patient Education  [employer].     If you have lab work done today you will be contacted with your lab results within the next 2 weeks.  If you have not heard from [place] then please contact us. The fastest way to get your results is to register for My Chart.   IF you received an x-ray today, you will receive an invoice from [facility]. Please contact [facility] at [phone] with questions or concerns regarding your invoice.   IF you received labwork today, you will receive an invoice from [facility]. Please contact [facility] at [phone] with questions or concerns regarding your invoice.   Our billing staff will not be able to assist you with questions regarding bills from these companies.  You will be contacted with the lab results as soon as they are available. The fastest way to get your results is to activate your My Chart account. Instructions are located on the last page of this paperwork. If you have not heard from [place] regarding the results in 2 weeks, please contact this office.

## 2020-10-12 NOTE — Progress Notes (Addendum)
12/15/202110:05 AM  Ralph Hayes Apr 15, 1956, 64 y.o., male [id number]  Chief Complaint  Patient presents with  . Hyperlipidemia    Follow up labs   . Dizziness    HPI:   Patient is a 64 y.o. male with past medical history significant for HLD who presents today for 6 month f/u.  Last OV: 05/11/20  Cataract surgery 1.5 months ago 8 years ago Lasik Doesn't feel like vision has improved Issues with vertigo Worse after the surgery  HLD Atorvastatin 40 mg daily [place] Q-10 daily Aspirin once a week Working on [employer] Wife Pescatarian  Lab Results  Component Value Date   CHOL 190 05/11/2020   HDL 47 05/11/2020   LDLCALC 118 (H) 05/11/2020   TRIG 143 05/11/2020   CHOLHDL 4.0 05/11/2020   GU Urine stream has improved Took Cialis once, has not needed it since Declines PSA this visit  Depression screen [facility] 2/9 10/12/2020 05/11/2020 04/01/2020  Decreased Interest 0 0 0  Down, Depressed, Hopeless 0 0 0  PHQ - 2 Score 0 0 0    Fall Risk  10/12/2020 05/11/2020 04/01/2020 09/01/2018 02/27/2018  Falls in the past year? 0 0 0 0 Yes  Number falls in past yr: 0 - 0 - 1  Injury with Fall? 0 - 0 - No  Follow up Falls evaluation completed Falls evaluation completed Falls evaluation completed - -     Allergies  Allergen Reactions  . Penicillins Swelling    SWELLING REACTION UNSPECIFIED   Has patient had a PCN reaction causing immediate rash, facial/tongue/throat swelling, SOB or lightheadedness with hypotension:unsure Has patient had a PCN reaction causing severe rash involving mucus membranes or skin necrosis:unsure Has patient had a PCN reaction that required hospitalization:No Has patient had a PCN reaction occurring within the last 10 years:unsure If all of the above answers are "NO", then may proceed with Cephalosporin use. Childhood reaction,    Prior to Admission medications   Medication Sig Start Date End Date Taking? Authorizing Provider  aspirin EC 81 MG tablet Take  81 mg by mouth once a week.     [provider]  atorvastatin (LIPITOR) 40 MG tablet TAKE 1 TABLET BY MOUTH EVERY DAY 09/30/20   [name], MD  co-enzyme Q-10 50 MG capsule Take 300 mg by mouth daily.    [provider]  tadalafil (CIALIS) 20 MG tablet Take 0.5-1 tablets (10-20 mg total) by mouth every other day as needed for erectile dysfunction. 04/05/20   [name], MD    Past Medical History:  Diagnosis Date  . Hyperlipidemia   . Medical history non-contributory   . Umbilical hernia     Past Surgical History:  Procedure Laterality Date  . COLONOSCOPY    . EYE SURGERY N/A    Phreesia 03/29/2020  . HERNIA REPAIR N/A    Phreesia 03/29/2020  . NO PAST SURGERIES    . UMBILICAL HERNIA REPAIR N/A 12/28/2016   Procedure: LAPAROSCOPIC UMBILICAL HERNIA;  Surgeon: Alphonsa Overall, MD;  Location: [place];  Service: General;  Laterality: N/A;  . UMBILICAL HERNIA REPAIR N/A 12/18/2018   Procedure: LAPAROSCOPIC EXPLORATION  AND REPAIR OF UMBILICAL HERNIA ERAS PATHWAY;  Surgeon: Alphonsa Overall, MD;  Location: [facility];  Service: General;  Laterality: N/A;    Social History   Tobacco Use  . Smoking status: Never Smoker  . Smokeless tobacco: Never Used  Substance Use Topics  . Alcohol use: Yes    Alcohol/week: 2.0 standard  drinks    Types: 2 Glasses of wine per week    Comment: twice a wk    Family History  Problem Relation Age of Onset  . COPD Mother   . Cancer Sister   . Diabetes Brother   . Cancer Maternal Grandfather   . Heart disease Paternal Grandmother   . Cancer Paternal Grandfather   . Colon cancer Neg Hx   . Esophageal cancer Neg Hx   . Liver cancer Neg Hx   . Pancreatic cancer Neg Hx   . Rectal cancer Neg Hx   . Stomach cancer Neg Hx     Review of Systems  Constitutional: Negative for chills, fever and malaise/fatigue.  HENT: Positive for tinnitus (baseline).   Eyes: Negative for blurred vision and double vision.       Left eye vision  changes post surgery  Respiratory: Negative for cough, shortness of breath and wheezing.   Cardiovascular: Negative for chest pain, palpitations and leg swelling.  Gastrointestinal: Negative for abdominal pain, blood in stool, constipation, diarrhea, heartburn, nausea and vomiting.  Genitourinary: Negative for dysuria, frequency and hematuria.  Musculoskeletal: Negative for back pain and joint pain.  Skin: Negative for rash.  Neurological: Positive for dizziness. Negative for weakness and headaches.    OBJECTIVE:  Today's Vitals   10/12/20 0940  BP: 128/82  Pulse: (!) 51  Temp: 97.9 F (36.6 C)  SpO2: 97%  Weight: 235 lb (106.6 kg)  Height: 6' (1.829 m)   Body mass index is 31.87 kg/m.   Physical Exam Vitals reviewed.  Constitutional:      Appearance: Normal appearance.  HENT:     Head: Normocephalic and atraumatic.  Eyes:     Conjunctiva/sclera: Conjunctivae normal.     Pupils: Pupils are equal, round, and reactive to light.  Cardiovascular:     Rate and Rhythm: Regular rhythm. Bradycardia present.     Pulses: Normal pulses.     Heart sounds: Normal heart sounds. No murmur heard. No friction rub. No gallop.   Pulmonary:     Effort: Pulmonary effort is normal. No respiratory distress.     Breath sounds: Normal breath sounds. No stridor. No wheezing or rales.  Abdominal:     General: Bowel sounds are normal.     Palpations: Abdomen is soft.     Tenderness: There is no abdominal tenderness.  Musculoskeletal:     Right lower leg: No edema.     Left lower leg: No edema.  Skin:    General: Skin is warm and dry.  Neurological:     General: No focal deficit present.     Mental Status: He is alert and oriented to person, place, and time.  Psychiatric:        Mood and Affect: Mood normal.        Behavior: Behavior normal.     No results found for this or any previous visit (from the past 24 hour(s)).  No results found.   ASSESSMENT and PLAN  Problem List  Items Addressed This Visit   None   Visit Diagnoses    Hyperlipidemia, unspecified hyperlipidemia type    -  Primary   Relevant Medications   atorvastatin (LIPITOR) 40 MG tablet   Other Relevant Orders   Lipid Panel   CMP14+EGFR  Will follow up with lab results   Dizziness     This has been an ongoing issue Worse since cataract surgery      Return in about 6 months (  around 04/12/2021).   Ralph Foley Kedrick Mcnamee, FNP-BC [facility] [place], [place] [address] Ph.  [phone] Fax [phone]  I have reviewed and agree with above documentation. Agustina Caroli, MD

## 2020-10-14 ENCOUNTER — Other Ambulatory Visit: Payer: Self-pay | Admitting: Family Medicine

## 2020-10-14 ENCOUNTER — Encounter: Payer: Self-pay | Admitting: Family Medicine

## 2020-10-14 DIAGNOSIS — E785 Hyperlipidemia, unspecified: Secondary | ICD-10-CM

## 2020-10-14 MED ORDER — ATORVASTATIN CALCIUM 40 MG PO TABS: 60.0000 mg | ORAL_TABLET | Freq: Every day | ORAL | 3 refills | Status: DC

## 2020-10-26 DIAGNOSIS — K080 Exfoliation of teeth due to systemic causes: Secondary | ICD-10-CM | POA: Diagnosis not present

## 2020-12-19 DIAGNOSIS — H35362 Drusen (degenerative) of macula, left eye: Secondary | ICD-10-CM | POA: Diagnosis not present

## 2020-12-19 DIAGNOSIS — Z9889 Other specified postprocedural states: Secondary | ICD-10-CM | POA: Diagnosis not present

## 2020-12-19 DIAGNOSIS — Z961 Presence of intraocular lens: Secondary | ICD-10-CM | POA: Diagnosis not present

## 2020-12-19 DIAGNOSIS — H26492 Other secondary cataract, left eye: Secondary | ICD-10-CM | POA: Diagnosis not present

## 2020-12-19 DIAGNOSIS — H2511 Age-related nuclear cataract, right eye: Secondary | ICD-10-CM | POA: Diagnosis not present

## 2020-12-19 DIAGNOSIS — Z9842 Cataract extraction status, left eye: Secondary | ICD-10-CM | POA: Diagnosis not present

## 2021-04-12 ENCOUNTER — Ambulatory Visit: Payer: Federal, State, Local not specified - PPO | Admitting: Family Medicine

## 2021-04-17 ENCOUNTER — Ambulatory Visit (INDEPENDENT_AMBULATORY_CARE_PROVIDER_SITE_OTHER): Payer: Federal, State, Local not specified - PPO | Admitting: Family Medicine

## 2021-04-17 ENCOUNTER — Other Ambulatory Visit: Payer: Self-pay

## 2021-04-17 VITALS — BP 138/76 | HR 64 | Temp 98.2°F | Resp 16 | Ht 72.0 in | Wt 230.6 lb

## 2021-04-17 DIAGNOSIS — Z1329 Encounter for screening for other suspected endocrine disorder: Secondary | ICD-10-CM | POA: Diagnosis not present

## 2021-04-17 DIAGNOSIS — E785 Hyperlipidemia, unspecified: Secondary | ICD-10-CM

## 2021-04-17 DIAGNOSIS — R3989 Other symptoms and signs involving the genitourinary system: Secondary | ICD-10-CM

## 2021-04-17 DIAGNOSIS — Z0000 Encounter for general adult medical examination without abnormal findings: Secondary | ICD-10-CM | POA: Diagnosis not present

## 2021-04-17 DIAGNOSIS — Z125 Encounter for screening for malignant neoplasm of prostate: Secondary | ICD-10-CM

## 2021-04-17 DIAGNOSIS — R739 Hyperglycemia, unspecified: Secondary | ICD-10-CM | POA: Diagnosis not present

## 2021-04-17 LAB — COMPREHENSIVE METABOLIC PANEL
ALT: 30 U/L (ref 0–53)
AST: 19 U/L (ref 0–37)
Albumin: 4.5 g/dL (ref 3.5–5.2)
Alkaline Phosphatase: 86 U/L (ref 39–117)
BUN: 19 mg/dL (ref 6–23)
CO2: 29 mEq/L (ref 19–32)
Calcium: 9.3 mg/dL (ref 8.4–10.5)
Chloride: 104 mEq/L (ref 96–112)
Creatinine, Ser: 0.92 mg/dL (ref 0.40–1.50)
GFR: 88.1 mL/min (ref >60.00)
Glucose, Bld: 94 mg/dL (ref 70–99)
Potassium: 4.3 mEq/L (ref 3.5–5.1)
Sodium: 142 mEq/L (ref 135–145)
Total Bilirubin: 1.6 mg/dL — ABNORMAL HIGH (ref 0.2–1.2)
Total Protein: 6.4 g/dL (ref 6.0–8.3)

## 2021-04-17 LAB — CBC WITH DIFFERENTIAL/PLATELET
Basophils Absolute: 0.1 10*3/uL (ref 0.0–0.1)
Basophils Relative: 1.2 % (ref 0.0–3.0)
Eosinophils Absolute: 0.1 10*3/uL (ref 0.0–0.7)
Eosinophils Relative: 2.9 % (ref 0.0–5.0)
HCT: 45.1 % (ref 39.0–52.0)
Hemoglobin: 15.1 g/dL (ref 13.0–17.0)
Lymphocytes Relative: 27.5 % (ref 12.0–46.0)
Lymphs Abs: 1.4 10*3/uL (ref 0.7–4.0)
MCHC: 33.4 g/dL (ref 30.0–36.0)
MCV: 88.1 fl (ref 78.0–100.0)
Monocytes Absolute: 0.5 10*3/uL (ref 0.1–1.0)
Monocytes Relative: 10.6 % (ref 3.0–12.0)
Neutro Abs: 2.9 10*3/uL (ref 1.4–7.7)
Neutrophils Relative %: 57.8 % (ref 43.0–77.0)
Platelets: 216 10*3/uL (ref 150.0–400.0)
RBC: 5.12 Mil/uL (ref 4.22–5.81)
RDW: 14.4 % (ref 11.5–15.5)
WBC: 5 10*3/uL (ref 4.0–10.5)

## 2021-04-17 LAB — PSA: PSA: 4.77 ng/mL — ABNORMAL HIGH (ref 0.10–4.00)

## 2021-04-17 LAB — LIPID PANEL
Cholesterol: 186 mg/dL (ref 0–200)
HDL: 59.1 mg/dL (ref >39.00)
LDL Cholesterol: 102 mg/dL — ABNORMAL HIGH (ref 0–99)
NonHDL: 126.86
Total CHOL/HDL Ratio: 3
Triglycerides: 125 mg/dL (ref 0.0–149.0)
VLDL: 25 mg/dL (ref 0.0–40.0)

## 2021-04-17 LAB — HEMOGLOBIN A1C: Hgb A1c MFr Bld: 5.7 % (ref 4.6–6.5)

## 2021-04-17 LAB — TSH: TSH: 2.76 u[IU]/mL (ref 0.35–4.50)

## 2021-04-17 NOTE — Progress Notes (Signed)
Subjective:  Patient ID: Ralph Hayes, male    DOB: July 07, 1956  Age: 65 y.o. MRN: [id number]  CC:  Chief Complaint  Patient presents with   Annual Exam    Pt due for physical, notes needs recheck on lab work     HPI Ralph Hayes presents for  Annual physical exam.   Hyperlipidemia: Last visit with Huston Foley Just in December 2021.  Had been on 20mg  lipitor, Increased dose Lipitor to 60 mg daily, coenzyme Q 10 daily. No side effects, but difficulty with cutting pills. Back on 40mg  qd past 4months.  Lab Results  Component Value Date   CHOL 219 (H) 10/12/2020   HDL 58 10/12/2020   LDLCALC 128 (H) 10/12/2020   TRIG 190 (H) 10/12/2020   CHOLHDL 3.8 10/12/2020   Lab Results  Component Value Date   ALT 52 (H) 10/12/2020   AST 23 10/12/2020   ALKPHOS 102 10/12/2020   BILITOT 1.3 (H) 10/12/2020   Vertigo Longstanding issue.  Discussed in December, previous cataract surgery with reported worse vertigo symptoms after surgery. None recently - improved after Eppley maneuver.   BPH: Suspected BPH previously.  Firm slightly enlarged prostate on his physical in June 2021.  No discrete nodules.  PSA was normal but slightly higher than previous readings.  54-month repeat testing was recommended.  Urology eval declined/deferred. Has Cialis if needed.  No new nocturia, or change in urgency. No hesitancy/change in stream. Rare nocturia. Not nightly.   Lab Results  Component Value Date   PSA1 3.8 03/29/2020   PSA1 3.4 09/01/2018   PSA1 2.9 09/12/2017   PSA 2.52 03/08/2016   PSA 2.56 04/07/2015   PSA 2.63 09/21/2014   Cancer screening Colonoscopy 05/12/2018, repeat 5 years Prostate, as above. The natural history of prostate cancer and ongoing controversy regarding screening and potential treatment outcomes of prostate cancer has been discussed with the patient. The meaning of a false positive PSA and a false negative PSA has been discussed. He indicates understanding of the limitations of  this screening test and wishes to proceed with screening PSA testing.  Immunization History  Administered Date(s) Administered   Influenza Inj Mdck Quad Pf 08/20/2018   Influenza Split 08/05/2012   Influenza,inj,Quad PF,6+ Mos 09/21/2014, 10/08/2016, 09/12/2017   Influenza-Unspecified 09/12/2020   Moderna Sars-Covid-2 Vaccination 01/04/2020, 02/01/2020   PFIZER(Purple Top)SARS-COV-2 Vaccination 10/02/2020   Tdap 11/25/2007, 02/27/2018   Varicella 11/25/2007   Zoster Recombinat (Shingrix) 03/03/2018, 07/26/2018   Zoster, Live 05/06/2014  COVID-vaccine second booster recommended. Planning on trip in few months.   Vision Screening   Right eye Left eye Both eyes  Without correction     With correction 20/15-1 20/50-1 20/20  Wears glasses, s/p cataracts. Last visit few months ago.   Dental: every 6 months. [facility] [employer].   Exercise/obesity: Competitive Ping pong last weekend, yardwork, dancing. Not quite at 157min.  Considering retirement soon. Working from home 3 days per week.  Wt Readings from Last 3 Encounters:  04/17/21 230 lb 9.6 oz (104.6 kg)  10/12/20 235 lb (106.6 kg)  05/25/20 (!) 235 lb (106.6 kg)   Body mass index is 31.27 kg/m. Glucose 104 in December.  Lab Results  Component Value Date   HGBA1C 5.4 03/29/2020     History Patient Active Problem List   Diagnosis Date Noted   Cervical disc disorder with radiculopathy of cervical region 11/17/2014   Acromioclavicular joint arthritis 11/17/2014   Bradycardia 05/04/2014   Past Medical History:  Diagnosis Date   Hyperlipidemia    Medical history non-contributory    Umbilical hernia    Past Surgical History:  Procedure Laterality Date   COLONOSCOPY     EYE SURGERY N/A    Phreesia 03/29/2020   HERNIA REPAIR N/A    Phreesia 03/29/2020   NO PAST SURGERIES     UMBILICAL HERNIA REPAIR N/A 12/28/2016   Procedure: LAPAROSCOPIC UMBILICAL HERNIA;  Surgeon: Alphonsa Overall, MD;  Location: [place];  Service:  General;  Laterality: N/A;   UMBILICAL HERNIA REPAIR N/A 12/18/2018   Procedure: LAPAROSCOPIC EXPLORATION  AND REPAIR OF UMBILICAL HERNIA ERAS PATHWAY;  Surgeon: Alphonsa Overall, MD;  Location: [facility];  Service: General;  Laterality: N/A;   Allergies  Allergen Reactions   Penicillins Swelling    SWELLING REACTION UNSPECIFIED   Has patient had a PCN reaction causing immediate rash, facial/tongue/throat swelling, SOB or lightheadedness with hypotension:unsure Has patient had a PCN reaction causing severe rash involving mucus membranes or skin necrosis:unsure Has patient had a PCN reaction that required hospitalization:No Has patient had a PCN reaction occurring within the last 10 years:unsure If all of the above answers are "NO", then may proceed with Cephalosporin use. Childhood reaction,   Prior to Admission medications   Medication Sig Start Date End Date Taking? Authorizing Provider  aspirin EC 81 MG tablet Take 81 mg by mouth once a week.  Patient not taking: Reported on 10/12/2020    [provider]  atorvastatin (LIPITOR) 40 MG tablet Take 1.5 tablets (60 mg total) by mouth daily. 10/14/20   Just, Laurita Quint, FNP  co-enzyme Q-10 50 MG capsule Take 300 mg by mouth daily.    [provider]  tadalafil (CIALIS) 20 MG tablet Take 0.5-1 tablets (10-20 mg total) by mouth every other day as needed for erectile dysfunction. Patient not taking: Reported on 10/12/2020 04/05/20   [name], MD   Social History   Socioeconomic History   Marital status: Married    Spouse name: Not on file   Number of children: Not on file   Years of education: Not on file   Highest education level: Not on file  Occupational History   Not on file  Tobacco Use   Smoking status: Never   Smokeless tobacco: Never  Vaping Use   Vaping Use: Never used  Substance and Sexual Activity   Alcohol use: Yes    Alcohol/week: 2.0 standard drinks    Types: 2 Glasses of wine per week    Comment:  twice a wk   Drug use: No   Sexual activity: Yes  Other Topics Concern   Not on file  Social History Narrative   Married. Education: [employer].    Social Determinants of Health   Financial Resource Strain: Not on file  Food Insecurity: Not on file  Transportation Needs: Not on file  Physical Activity: Not on file  Stress: Not on file  Social Connections: Not on file  Intimate Partner Violence: Not on file    Review of Systems  Constitutional: Negative.   HENT: Negative.    Eyes: Negative.   Respiratory: Negative.    Cardiovascular: Negative.   Gastrointestinal: Negative.   Genitourinary: Negative.   Musculoskeletal: Negative.   Skin: Negative.   Neurological: Negative.   Psychiatric/Behavioral: Negative.    All other systems reviewed and are negative.   Objective:   Vitals:   04/17/21 0922  BP: 138/76  Pulse: 64  Resp: 16  Temp: 98.2 F (  36.8 C)  TempSrc: Temporal  SpO2: 96%  Weight: 230 lb 9.6 oz (104.6 kg)  Height: 6' (1.829 m)     Physical Exam Vitals reviewed.  Constitutional:      Appearance: He is well-developed.  HENT:     Head: Normocephalic and atraumatic.     Right Ear: External ear normal.     Left Ear: External ear normal.  Eyes:     Conjunctiva/sclera: Conjunctivae normal.     Pupils: Pupils are equal, round, and reactive to light.  Neck:     Thyroid: No thyromegaly.  Cardiovascular:     Rate and Rhythm: Normal rate and regular rhythm.     Heart sounds: Normal heart sounds.  Pulmonary:     Effort: Pulmonary effort is normal. No respiratory distress.     Breath sounds: Normal breath sounds. No wheezing.  Abdominal:     General: There is no distension.     Palpations: Abdomen is soft.     Tenderness: There is no abdominal tenderness.     Hernia: There is no hernia in the left inguinal area or right inguinal area.  Genitourinary:    Prostate: Enlarged (Prominent right side versus left of prostate with firm area on the right, question  nodule versus diffuse enlargement.). Not tender.  Musculoskeletal:        General: No tenderness. Normal range of motion.     Cervical back: Normal range of motion and neck supple.  Lymphadenopathy:     Cervical: No cervical adenopathy.  Skin:    General: Skin is warm and dry.  Neurological:     Mental Status: He is alert and oriented to person, place, and time.     Deep Tendon Reflexes: Reflexes are normal and symmetric.  Psychiatric:        Behavior: Behavior normal.       Assessment & Plan:  Ralph Hayes is a 65 y.o. male . Annual physical exam - Plan: CBC with Differential/Platelet, Comprehensive metabolic panel, Lipid panel, PSA, Hemoglobin A1c  - -anticipatory guidance as below in AVS, screening labs above. Health maintenance items as above in HPI discussed/recommended as applicable.   Hyperlipidemia, unspecified hyperlipidemia type - Plan: CBC with Differential/Platelet, Comprehensive metabolic panel, Lipid panel, TSH  -Check labs at current dose of Lipitor.  Continue same dose 40 mg for now.  Hyperglycemia - Plan: Lipid panel, Hemoglobin A1c  -Increase activity/exercise discussed with goal of 150 minutes/week.  Commended on current activity.  Check A1c.  Screening for prostate cancer - Plan: PSA Abnormal prostate exam - Plan: Ambulatory referral to Urology  -Asymmetric versus nodular prostate on the right.  Refer to urology for secondary exam and repeat PSA.  Very slow increase in PSA over the past few years.  No new symptoms.  Screening for thyroid disorder - Plan: TSH   No orders of the defined types were placed in this encounter.  Patient Instructions  Thanks for coming in today.  I will refer you to urology for second exam but also am checking PSA today.  I will check cholesterol levels at the current 40 mg dose.  Continue activity with goal of 150 minutes total per week.  Recheck in 6 months.  Have a great trip in the next few months.  Let me know if there are  questions sooner.  Keeping you healthy  Get these tests Blood pressure- Have your blood pressure checked once a year by your healthcare provider.  Normal blood pressure is 120/80  Weight- Have your body mass index (BMI) calculated to screen for obesity.  BMI is a measure of body fat based on height and weight. You can also calculate your own BMI at ViewBanking.si. Cholesterol- Have your cholesterol checked every year. Diabetes- Have your blood sugar checked regularly if you have high blood pressure, high cholesterol, have a family history of diabetes or if you are overweight. Screening for Colon Cancer- Colonoscopy starting at age 71.  Screening may begin sooner depending on your family history and other health conditions. Follow up colonoscopy as directed by your Gastroenterologist. Screening for Prostate Cancer- Both blood work (PSA) and a rectal exam help screen for Prostate Cancer.  Screening begins at age 62 with African-American men and at age 64 with Caucasian men.  Screening may begin sooner depending on your family history.  Take these medicines Flu shot- Every fall. Tetanus- Every 10 years. Pneumonia shot- Once after the age of 50; if you are younger than 46, ask your healthcare provider if you need a Pneumonia shot.  Take these steps Don't smoke- If you do smoke, talk to your doctor about quitting.  For tips on how to quit, go to www.smokefree.gov or call 1-800-QUIT-NOW. Be physically active- Exercise 5 days a week for at least 30 minutes.  If you are not already physically active start slow and gradually work up to 30 minutes of moderate physical activity.  Examples of moderate activity include walking briskly, mowing the yard, dancing, swimming, bicycling, etc. Eat a healthy diet- Eat a variety of healthy food such as fruits, vegetables, low fat milk, low fat cheese, yogurt, lean meant, poultry, fish, beans, tofu, etc. For more information go to  www.thenutritionsource.org Drink alcohol in moderation- Limit alcohol intake to less than two drinks a day. Never drink and drive. Dentist- Brush and floss twice daily; visit your dentist twice a year. Depression- Your emotional health is as important as your physical health. If you're feeling down, or losing interest in things you would normally enjoy please talk to your healthcare provider. Eye exam- Visit your eye doctor every year. Safe sex- If you may be exposed to a sexually transmitted infection, use a condom. Seat belts- Seat belts can save your life; always wear one. Smoke/Carbon Monoxide detectors- These detectors need to be installed on the appropriate level of your home.  Replace batteries at least once a year. Skin cancer- When out in the sun, cover up and use sunscreen 15 SPF or higher. Violence- If anyone is threatening you, please tell your healthcare provider. Living Will/ Health care power of attorney- Speak with your healthcare provider and family.    Signed,   [name], MD [place], [facility] 04/17/21 10:26 AM

## 2021-04-17 NOTE — Patient Instructions (Signed)
Thanks for coming in today.  I will refer you to urology for second exam but also am checking PSA today.  I will check cholesterol levels at the current 40 mg dose.  Continue activity with goal of 150 minutes total per week.  Recheck in 6 months.  Have a great trip in the next few months.  Let me know if there are questions sooner.  Keeping you healthy  Get these tests Blood pressure- Have your blood pressure checked once a year by your healthcare provider.  Normal blood pressure is 120/80 Weight- Have your body mass index (BMI) calculated to screen for obesity.  BMI is a measure of body fat based on height and weight. You can also calculate your own BMI at ViewBanking.si. Cholesterol- Have your cholesterol checked every year. Diabetes- Have your blood sugar checked regularly if you have high blood pressure, high cholesterol, have a family history of diabetes or if you are overweight. Screening for Colon Cancer- Colonoscopy starting at age 91.  Screening may begin sooner depending on your family history and other health conditions. Follow up colonoscopy as directed by your Gastroenterologist. Screening for Prostate Cancer- Both blood work (PSA) and a rectal exam help screen for Prostate Cancer.  Screening begins at age 59 with African-American men and at age 27 with Caucasian men.  Screening may begin sooner depending on your family history.  Take these medicines Flu shot- Every fall. Tetanus- Every 10 years. Pneumonia shot- Once after the age of 69; if you are younger than 56, ask your healthcare provider if you need a Pneumonia shot.  Take these steps Don't smoke- If you do smoke, talk to your doctor about quitting.  For tips on how to quit, go to www.smokefree.gov or call 1-800-QUIT-NOW. Be physically active- Exercise 5 days a week for at least 30 minutes.  If you are not already physically active start slow and gradually work up to 30 minutes of moderate physical activity.  Examples  of moderate activity include walking briskly, mowing the yard, dancing, swimming, bicycling, etc. Eat a healthy diet- Eat a variety of healthy food such as fruits, vegetables, low fat milk, low fat cheese, yogurt, lean meant, poultry, fish, beans, tofu, etc. For more information go to www.thenutritionsource.org Drink alcohol in moderation- Limit alcohol intake to less than two drinks a day. Never drink and drive. Dentist- Brush and floss twice daily; visit your dentist twice a year. Depression- Your emotional health is as important as your physical health. If you're feeling down, or losing interest in things you would normally enjoy please talk to your healthcare provider. Eye exam- Visit your eye doctor every year. Safe sex- If you may be exposed to a sexually transmitted infection, use a condom. Seat belts- Seat belts can save your life; always wear one. Smoke/Carbon Monoxide detectors- These detectors need to be installed on the appropriate level of your home.  Replace batteries at least once a year. Skin cancer- When out in the sun, cover up and use sunscreen 15 SPF or higher. Violence- If anyone is threatening you, please tell your healthcare provider. Living Will/ Health care power of attorney- Speak with your healthcare provider and family.

## 2021-04-19 DIAGNOSIS — L03119 Cellulitis of unspecified part of limb: Secondary | ICD-10-CM | POA: Diagnosis not present

## 2021-06-14 DIAGNOSIS — N400 Enlarged prostate without lower urinary tract symptoms: Secondary | ICD-10-CM | POA: Diagnosis not present

## 2021-06-14 DIAGNOSIS — R972 Elevated prostate specific antigen [PSA]: Secondary | ICD-10-CM | POA: Diagnosis not present

## 2021-08-03 DIAGNOSIS — D225 Melanocytic nevi of trunk: Secondary | ICD-10-CM | POA: Diagnosis not present

## 2021-08-03 DIAGNOSIS — L821 Other seborrheic keratosis: Secondary | ICD-10-CM | POA: Diagnosis not present

## 2021-08-03 DIAGNOSIS — L578 Other skin changes due to chronic exposure to nonionizing radiation: Secondary | ICD-10-CM | POA: Diagnosis not present

## 2021-08-03 DIAGNOSIS — L281 Prurigo nodularis: Secondary | ICD-10-CM | POA: Diagnosis not present

## 2021-10-18 ENCOUNTER — Encounter: Payer: Self-pay | Admitting: Family Medicine

## 2021-10-18 ENCOUNTER — Ambulatory Visit: Payer: Federal, State, Local not specified - PPO | Admitting: Family Medicine

## 2021-10-18 VITALS — BP 132/76 | HR 79 | Temp 98.3°F | Ht 72.0 in | Wt 233.6 lb

## 2021-10-18 DIAGNOSIS — R17 Unspecified jaundice: Secondary | ICD-10-CM

## 2021-10-18 DIAGNOSIS — E785 Hyperlipidemia, unspecified: Secondary | ICD-10-CM

## 2021-10-18 DIAGNOSIS — R739 Hyperglycemia, unspecified: Secondary | ICD-10-CM

## 2021-10-18 DIAGNOSIS — R0789 Other chest pain: Secondary | ICD-10-CM

## 2021-10-18 DIAGNOSIS — I340 Nonrheumatic mitral (valve) insufficiency: Secondary | ICD-10-CM | POA: Diagnosis not present

## 2021-10-18 LAB — COMPREHENSIVE METABOLIC PANEL
ALT: 39 U/L (ref 0–53)
AST: 22 U/L (ref 0–37)
Albumin: 4.2 g/dL (ref 3.5–5.2)
Alkaline Phosphatase: 80 U/L (ref 39–117)
BUN: 21 mg/dL (ref 6–23)
CO2: 32 mEq/L (ref 19–32)
Calcium: 9.1 mg/dL (ref 8.4–10.5)
Chloride: 104 mEq/L (ref 96–112)
Creatinine, Ser: 0.96 mg/dL (ref 0.40–1.50)
GFR: 83.41 mL/min (ref >60.00)
Glucose, Bld: 98 mg/dL (ref 70–99)
Potassium: 4.1 mEq/L (ref 3.5–5.1)
Sodium: 141 mEq/L (ref 135–145)
Total Bilirubin: 1.8 mg/dL — ABNORMAL HIGH (ref 0.2–1.2)
Total Protein: 6.4 g/dL (ref 6.0–8.3)

## 2021-10-18 LAB — LIPID PANEL
Cholesterol: 193 mg/dL (ref 0–200)
HDL: 55.8 mg/dL (ref >39.00)
LDL Cholesterol: 105 mg/dL — ABNORMAL HIGH (ref 0–99)
NonHDL: 137.32
Total CHOL/HDL Ratio: 3
Triglycerides: 163 mg/dL — ABNORMAL HIGH (ref 0.0–149.0)
VLDL: 32.6 mg/dL (ref 0.0–40.0)

## 2021-10-18 LAB — HEMOGLOBIN A1C: Hgb A1c MFr Bld: 5.7 % (ref 4.6–6.5)

## 2021-10-18 MED ORDER — ATORVASTATIN CALCIUM 40 MG PO TABS: 40.0000 mg | ORAL_TABLET | Freq: Every day | ORAL | 2 refills | Status: DC

## 2021-10-18 NOTE — Patient Instructions (Signed)
I will refer you to cardiology to evaluate need for repeat echocardiogram.  Chest wall pain appears to be musculoskeletal.  Tylenol every morning is fine if needed, or heat or ice.  If any changes or worsening pain return for recheck and possible repeat imaging. I will recheck your labs today including the bilirubin which was slightly elevated last time but other liver tests looked okay, and 22-month blood sugar test.  No change in cholesterol medication at this time.  Thanks for coming in today and take care.

## 2021-10-18 NOTE — Progress Notes (Signed)
Subjective:  Patient ID: Ralph Hayes, male    DOB: Mar 12, 1956  Age: 65 y.o. MRN: [id number]  CC:  Chief Complaint  Patient presents with   Follow-up    6 month follow up, no concerns. Patient fasting.     HPI COUNCIL MUNGUIA presents for   L sided chest pain: For years, chest wall in past. Lower left ribs. No recent changes. No chest pressure/dyspnea. No changes. No new symptoms. Once per week at rest. No CP with exertion or dyspnea. No hematuria.  Tx: none.  Evaluated for chest pain by his previous primary care provider in 2016, including ER visit and cardiology eval. Echo in June 2016 with EF 66%, slight dilation of left atrial cavity, mild to moderate mitral regurgitation, mild tricuspid regurgitation, no evidence of pulmonary hypertension. Stress test in July 2016.  Bruce protocol.  10.16 METS.  No ischemia.  Continue primary prevention.  CT previously in 2015 indicated small left renal cyst, old L2 compression fracture but no other abnormalities.  Probable musculoskeletal pain at that time.  No changes.   Hyperlipidemia: Improved readings last visit on Lipitor 40 mg daily, combined with coenzyme Q 10.  Fasting this morning.  No new side effects with meds.  Lab Results  Component Value Date   CHOL 186 04/17/2021   HDL 59.10 04/17/2021   LDLCALC 102 (H) 04/17/2021   TRIG 125.0 04/17/2021   CHOLHDL 3 04/17/2021   Lab Results  Component Value Date   ALT 30 04/17/2021   AST 19 04/17/2021   ALKPHOS 86 04/17/2021   BILITOT 1.6 (H) 04/17/2021   BPH, elevated PSA Discussed in June.  PSA normal but slightly higher than previous readings on previous testing, then increased to 4.77 in June.  Abnormal prostate exam in June with firmness of the right side, PSA had been slowly increasing.  Referred to urology.  Saw urology in August. PSA decreased to 3.26, plan for follow up as needed. Prostate enlarged, but not concerned.   Lab Results  Component Value Date   PSA1 3.8  03/29/2020   PSA1 3.4 09/01/2018   PSA1 2.9 09/12/2017   PSA 4.77 (H) 04/17/2021   PSA 2.52 03/08/2016   PSA 2.56 04/07/2015   Elevated bilirubin 1.6 in June, 1.3 previously in December 2021.no abd pain, no n/v/yellowing of skin/eyes.   Prediabetes: Borderline with A1c 5.7 in June.  Diet/exercise approach discussed. No changes in diet or exercise since June. Wife is Vegan, eats healthy overall.  Backyard work for exercise. Less dancing. Tai Chi at times.  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/04/2014   Past Medical History:  Diagnosis Date   Hyperlipidemia    Medical history non-contributory    Umbilical hernia    Past Surgical History:  Procedure Laterality Date   COLONOSCOPY     EYE SURGERY N/A    Phreesia 03/29/2020   HERNIA REPAIR N/A    Phreesia 03/29/2020   NO PAST SURGERIES     UMBILICAL HERNIA REPAIR N/A 12/28/2016   Procedure: LAPAROSCOPIC UMBILICAL HERNIA;  Surgeon: Alphonsa Overall, MD;  Location:  [facility];  Service: General;  Laterality: N/A;   UMBILICAL HERNIA REPAIR N/A 12/18/2018   Procedure: LAPAROSCOPIC EXPLORATION  AND REPAIR OF UMBILICAL HERNIA ERAS PATHWAY;  Surgeon: Alphonsa Overall, MD;  Location: [facility];  Service: General;  Laterality: N/A;   Allergies  Allergen Reactions   Penicillins Swelling    SWELLING REACTION UNSPECIFIED   Has patient had a PCN reaction causing immediate rash, facial/tongue/throat swelling, SOB or lightheadedness with hypotension:unsure Has patient had a PCN reaction causing severe rash involving mucus membranes or skin necrosis:unsure Has patient had a PCN reaction that  required hospitalization:No Has patient had a PCN reaction occurring within the last 10 years:unsure If all of the above answers are "NO", then may proceed with Cephalosporin use. Childhood reaction,   Prior to Admission medications   Medication Sig Start Date End Date Taking? Authorizing Provider  atorvastatin (LIPITOR) 40 MG tablet Take 1.5 tablets (60 mg total) by mouth daily. Patient taking differently: Take 40 mg by mouth daily. 10/14/20  Yes Just, Laurita Quint, FNP  co-enzyme Q-10 50 MG capsule Take 300 mg by mouth daily.   Yes [provider]   Social History   Socioeconomic History   Marital status: Married    Spouse name: Not on file   Number of children: Not on file   Years of education: Not on file   Highest education level: Not on file  Occupational History   Not on file  Tobacco Use   Smoking status: Never   Smokeless tobacco: Never  Vaping Use   Vaping Use: Never used  Substance and Sexual Activity   Alcohol use: Yes    Alcohol/week: 2.0 standard drinks    Types: 2 Glasses of wine per week    Comment: twice a wk   Drug use: No   Sexual activity: Yes  Other Topics Concern   Not on file  Social History Narrative   Married. Education: [employer].    Social Determinants of Health   Financial Resource Strain: Not on file  Food Insecurity: Not on file  Transportation Needs: Not on file  Physical Activity: Not on file  Stress: Not on file  Social Connections: Not on file  Intimate Partner Violence: Not on file    Review of Systems   Objective:   Vitals:   10/18/21 0823  BP: 132/76  Pulse: 79  Temp: 98.3 F (36.8 C)  TempSrc: Temporal  SpO2: 97%  Weight: 233 lb 9.6 oz (106 kg)  Height: 6' (1.829 m)     Physical Exam Vitals reviewed.  Constitutional:      Appearance: He is well-developed.  HENT:     Head: Normocephalic and atraumatic.  Neck:     Vascular: No carotid bruit or JVD.  Cardiovascular:     Rate and Rhythm: Normal rate and  regular rhythm.     Heart sounds: Normal heart sounds. No murmur heard.    Comments: Reproducible chest wall pain of the left lower rib margin, no masses palpated.  Skin intact. Pulmonary:     Effort: Pulmonary effort is normal.     Breath sounds: Normal breath sounds. No rales.  Abdominal:     General: Abdomen is flat.     Palpations: Abdomen is soft.     Tenderness: There is no abdominal tenderness.  Musculoskeletal:     Right lower leg: No edema.     Left lower leg: No edema.  Skin:    General: Skin is warm and dry.  Neurological:     Mental Status: He is alert and oriented to person, place, and time.  Psychiatric:        Mood and Affect: Mood normal.       Assessment & Plan:  ASBURY HAIR is a 65 y.o. male . Hyperlipidemia, unspecified hyperlipidemia type - Plan: Comprehensive metabolic panel, Lipid panel  = Tolerating current regimen, updated labs ordered.Continue Lipitor 40 mg daily.  Left-sided chest wall pain  -Chronic, no recent changes.  Previous evaluation by cardiology including reassuring stress test, no anginal symptoms.    Mitral valve insufficiency, unspecified etiology - Plan: Ambulatory referral to Cardiology Will refer back to cardiology given previous abnormalities on echocardiogram and decision on repeat echo intervals.  No murmur or concerns on exam today.  Hyperglycemia - Plan: Hemoglobin A1c  -Barely at prediabetes level previously, repeat A1c.  Continue to watch diet, exercise.  Elevated bilirubin  -Recheck CMP.  Consider other testing for possible Gilbert's if persistent elevation.  No orders of the defined types were placed in this encounter.  Patient Instructions  I will refer you to cardiology to evaluate need for repeat echocardiogram.  Chest wall pain appears to be musculoskeletal.  Tylenol every morning is fine if needed, or heat or ice.  If any changes or worsening pain return for recheck and possible repeat imaging. I will recheck your  labs today including the bilirubin which was slightly elevated last time but other liver tests looked okay, and 58-monthblood sugar test.  No change in cholesterol medication at this time.  Thanks for coming in today and take care.    Signed,   JMerri Ray MD [facility] 10/18/21 9:40 AM

## 2021-11-15 ENCOUNTER — Ambulatory Visit: Payer: Federal, State, Local not specified - PPO | Admitting: Cardiology

## 2021-11-20 ENCOUNTER — Other Ambulatory Visit: Payer: Self-pay | Admitting: Family Medicine

## 2021-11-20 DIAGNOSIS — R17 Unspecified jaundice: Secondary | ICD-10-CM

## 2021-11-20 NOTE — Progress Notes (Signed)
Elevated bilirubin.  Lab only visit to evaluate for potential causes and labs obtained for possible Gilbert syndrome.

## 2021-11-21 ENCOUNTER — Other Ambulatory Visit (INDEPENDENT_AMBULATORY_CARE_PROVIDER_SITE_OTHER): Payer: Federal, State, Local not specified - PPO

## 2021-11-21 DIAGNOSIS — R17 Unspecified jaundice: Secondary | ICD-10-CM | POA: Diagnosis not present

## 2021-11-21 LAB — CBC
HCT: 45.1 % (ref 39.0–52.0)
Hemoglobin: 14.7 g/dL (ref 13.0–17.0)
MCHC: 32.7 g/dL (ref 30.0–36.0)
MCV: 87.3 fl (ref 78.0–100.0)
Platelets: 277 10*3/uL (ref 150.0–400.0)
RBC: 5.16 Mil/uL (ref 4.22–5.81)
RDW: 13.3 % (ref 11.5–15.5)
WBC: 5.8 10*3/uL (ref 4.0–10.5)

## 2021-11-22 DIAGNOSIS — R002 Palpitations: Secondary | ICD-10-CM | POA: Insufficient documentation

## 2021-11-22 LAB — PATHOLOGIST SMEAR REVIEW

## 2021-11-22 LAB — BILIRUBIN, FRACTIONATED(TOT/DIR/INDIR)
Bilirubin, Direct: 0.3 mg/dL — ABNORMAL HIGH (ref 0.0–0.2)
Indirect Bilirubin: 0.7 mg/dL (calc) (ref 0.2–1.2)
Total Bilirubin: 1 mg/dL (ref 0.2–1.2)

## 2021-11-22 LAB — RETICULOCYTES
ABS Retic: 77850 cells/uL (ref 25000–90000)
Retic Ct Pct: 1.5 %

## 2021-11-22 NOTE — Progress Notes (Signed)
Patient referred by [name], MD for chest pain  Subjective:   Ralph Hayes, male    DOB: May 21, 1956, 66 y.o.   MRN: [id number]   Chief Complaint  Patient presents with   Mitral Valve Prolapse   New Patient (Initial Visit)     HPI  66 y.o. Caucasian male with chest pain  Patient was last seen by Dr. Einar Gip in 2016.  He works as an Chief Financial Officer at [employer].  He is to be more active prior to [place].  Since then, he has been doing more yard work, currently building a Psychologist, sport and exercise.  Few days ago, he had left-sided dull pain lasting for a few minutes, unrelated to exertion.  He was recommended by his PCP Dr. Nyoka Cowden to get it evaluated by cardiology, since his last cardiac work-up with stress test and echocardiogram was back in 2016, details below.  Patient is currently on atorvastatin 40 mg daily.  He tried cutting it in half, which led to increase in his LDL.  Therefore, he is now back on 40 mg of atorvastatin.  Blood pressure slightly elevated today.  Usually, it is well controlled.   Past Medical History:  Diagnosis Date   Hyperlipidemia    Medical history non-contributory    Umbilical hernia      Past Surgical History:  Procedure Laterality Date   COLONOSCOPY     EYE SURGERY N/A    Phreesia 03/29/2020   HERNIA REPAIR N/A    Phreesia 03/29/2020   NO PAST SURGERIES     UMBILICAL HERNIA REPAIR N/A 12/28/2016   Procedure: LAPAROSCOPIC UMBILICAL HERNIA;  Surgeon: Alphonsa Overall, MD;  Location: [facility];  Service: General;  Laterality: N/A;   UMBILICAL HERNIA REPAIR N/A 12/18/2018   Procedure: LAPAROSCOPIC EXPLORATION  AND REPAIR OF UMBILICAL HERNIA ERAS PATHWAY;  Surgeon: Alphonsa Overall, MD;  Location: [facility];  Service: General;  Laterality: N/A;     Social History   Tobacco Use  Smoking Status Never  Smokeless Tobacco Never    Social History   Substance and Sexual Activity  Alcohol Use Yes   Alcohol/week: 2.0 standard drinks   Types: 2 Glasses of wine  per week   Comment: twice a wk     Family History  Problem Relation Age of Onset   COPD Mother    Cancer Sister    Diabetes Brother    Cancer Maternal Grandfather    Heart disease Paternal Grandmother    Cancer Paternal Grandfather    Colon cancer Neg Hx    Esophageal cancer Neg Hx    Liver cancer Neg Hx    Pancreatic cancer Neg Hx    Rectal cancer Neg Hx    Stomach cancer Neg Hx     Current Outpatient Medications on File Prior to Visit  Medication Sig Dispense Refill   atorvastatin (LIPITOR) 40 MG tablet Take 1 tablet (40 mg total) by mouth daily. 90 tablet 2   co-enzyme Q-10 50 MG capsule Take 300 mg by mouth daily.     No current facility-administered medications on file prior to visit.    Cardiovascular and other pertinent studies:  EKG 11/23/2021: Sinus rhythm 68 bpm  Left anterior fascicular block  Exercise treadmill stress test 04/2015: 10.1 METS 1.5 mm upsloping ST depression, normalized at <1 minute. Normal stress test  Echocardiogram 03/2015:    EF 66% Slight LA dilatation Mild ot mod MR Mild TR    Recent labs: 10/18/2021: Glucose  98, BUN/Cr 21/0.96. EGFR 83. Na/K 141/4.1. Rest of the CMP normal H/H 14/45. MCV 87. Platelets 277 HbA1C 5.7% Chol 193, TG 163, HDL 55, LDL 105 TSH 2.7 normal (03/2021)   Review of Systems  Cardiovascular:  Positive for chest pain. Negative for dyspnea on exertion, leg swelling, palpitations and syncope.        Vitals:   11/23/21 0836  BP: (!) 142/83  Pulse: (!) 58  Resp: 16  Temp: 98 F (36.7 C)  SpO2: 98%     Body mass index is 31.46 kg/m. Filed Weights   11/23/21 0836  Weight: 232 lb (105.2 kg)    Objective:   Physical Exam Vitals and nursing note reviewed.  Constitutional:      General: He is not in acute distress. Neck:     Vascular: No JVD.  Cardiovascular:     Rate and Rhythm: Normal rate and regular rhythm.     Heart sounds: Normal heart sounds. No murmur heard. Pulmonary:     Effort:  Pulmonary effort is normal.     Breath sounds: Normal breath sounds. No wheezing or rales.  Musculoskeletal:     Right lower leg: No edema.     Left lower leg: No edema.        Assessment & Recommendations:   66 year old Caucasian male with chest pain  Chest pain: Atypical.  Recommend exercise treadmill stress test, CT cardiac scoring for risk stratification. He has prior known mild MR.  On exam, I do not hear any loud murmur.  We will check echocardiogram for surveillance.  Further recommendations after above testing.  I will see him on as-needed basis, unless any significant abnormalities found on above testing.   Thank you for referring the patient to [place]. Please feel free to contact with any questions.   Nigel Mormon, MD Pager: [phone] Office: [phone]

## 2021-11-23 ENCOUNTER — Ambulatory Visit: Payer: Federal, State, Local not specified - PPO | Admitting: Cardiology

## 2021-11-23 ENCOUNTER — Encounter: Payer: Self-pay | Admitting: Cardiology

## 2021-11-23 ENCOUNTER — Other Ambulatory Visit: Payer: Self-pay

## 2021-11-23 VITALS — BP 142/83 | HR 58 | Temp 98.0°F | Resp 16 | Ht 72.0 in | Wt 232.0 lb

## 2021-11-23 DIAGNOSIS — I444 Left anterior fascicular block: Secondary | ICD-10-CM | POA: Insufficient documentation

## 2021-11-23 DIAGNOSIS — I340 Nonrheumatic mitral (valve) insufficiency: Secondary | ICD-10-CM

## 2021-11-23 DIAGNOSIS — R072 Precordial pain: Secondary | ICD-10-CM | POA: Diagnosis not present

## 2021-12-07 ENCOUNTER — Ambulatory Visit: Payer: Federal, State, Local not specified - PPO

## 2021-12-07 ENCOUNTER — Other Ambulatory Visit: Payer: Self-pay

## 2021-12-07 DIAGNOSIS — R072 Precordial pain: Secondary | ICD-10-CM

## 2021-12-14 ENCOUNTER — Ambulatory Visit: Payer: Federal, State, Local not specified - PPO | Admitting: Family Medicine

## 2021-12-14 DIAGNOSIS — H35362 Drusen (degenerative) of macula, left eye: Secondary | ICD-10-CM | POA: Diagnosis not present

## 2021-12-14 DIAGNOSIS — Z9842 Cataract extraction status, left eye: Secondary | ICD-10-CM | POA: Diagnosis not present

## 2021-12-14 DIAGNOSIS — H2511 Age-related nuclear cataract, right eye: Secondary | ICD-10-CM | POA: Diagnosis not present

## 2021-12-14 DIAGNOSIS — Z961 Presence of intraocular lens: Secondary | ICD-10-CM | POA: Diagnosis not present

## 2021-12-15 ENCOUNTER — Other Ambulatory Visit: Payer: Self-pay | Admitting: Cardiology

## 2021-12-15 ENCOUNTER — Ambulatory Visit
Admission: RE | Admit: 2021-12-15 | Discharge: 2021-12-15 | Disposition: A | Discharge status: Home / Self Care | Payer: No Typology Code available for payment source | Source: Ambulatory Visit | Attending: Cardiology | Admitting: Cardiology

## 2021-12-15 DIAGNOSIS — E782 Mixed hyperlipidemia: Secondary | ICD-10-CM

## 2021-12-15 DIAGNOSIS — E783 Hyperchylomicronemia: Secondary | ICD-10-CM

## 2021-12-15 DIAGNOSIS — I2510 Atherosclerotic heart disease of native coronary artery without angina pectoris: Secondary | ICD-10-CM | POA: Diagnosis not present

## 2021-12-15 DIAGNOSIS — R072 Precordial pain: Secondary | ICD-10-CM

## 2021-12-15 DIAGNOSIS — I10 Essential (primary) hypertension: Secondary | ICD-10-CM

## 2021-12-15 NOTE — Progress Notes (Signed)
Staff, he will need lipid panel and echocardiogram in August followed by office visit in September.  Documentation below for records only.  Reviewed CT cardiac scoring, exercise 7 stress test, and echocardiogram results with the patient.  Mildly elevated calcium score.  Okay to continue Lipitor 40 mg daily.  Patient is going to make some changes to his diet and lifestyle.  We will repeat Pete panel in 6 months.  Exercise treadmill stress test was normal.  Noted to have EF of 45 to 50% on echocardiogram, which is lower than EF of 60 to 65% in 2016.  Patient has no signs or symptoms of heart failure.  I suspect this may be related to hypertension.  Recommend repeat echocardiogram in 6 months.  Follow-up in September 2023.  Nigel Mormon, MD

## 2022-02-07 DIAGNOSIS — N400 Enlarged prostate without lower urinary tract symptoms: Secondary | ICD-10-CM | POA: Diagnosis not present

## 2022-02-14 DIAGNOSIS — N400 Enlarged prostate without lower urinary tract symptoms: Secondary | ICD-10-CM | POA: Diagnosis not present

## 2022-04-17 ENCOUNTER — Encounter: Payer: Self-pay | Admitting: Family Medicine

## 2022-04-20 ENCOUNTER — Ambulatory Visit: Payer: Federal, State, Local not specified - PPO | Admitting: Family Medicine

## 2022-04-20 DIAGNOSIS — E785 Hyperlipidemia, unspecified: Secondary | ICD-10-CM | POA: Diagnosis not present

## 2022-04-20 LAB — COMPREHENSIVE METABOLIC PANEL
ALT: 35 U/L (ref 0–53)
AST: 21 U/L (ref 0–37)
Albumin: 4.4 g/dL (ref 3.5–5.2)
Alkaline Phosphatase: 88 U/L (ref 39–117)
BUN: 20 mg/dL (ref 6–23)
CO2: 31 mEq/L (ref 19–32)
Calcium: 9.5 mg/dL (ref 8.4–10.5)
Chloride: 103 mEq/L (ref 96–112)
Creatinine, Ser: 0.97 mg/dL (ref 0.40–1.50)
GFR: 82.09 mL/min (ref >60.00)
Glucose, Bld: 108 mg/dL — ABNORMAL HIGH (ref 70–99)
Potassium: 4.5 mEq/L (ref 3.5–5.1)
Sodium: 141 mEq/L (ref 135–145)
Total Bilirubin: 1.6 mg/dL — ABNORMAL HIGH (ref 0.2–1.2)
Total Protein: 6.8 g/dL (ref 6.0–8.3)

## 2022-04-20 LAB — LIPID PANEL
Cholesterol: 207 mg/dL — ABNORMAL HIGH (ref 0–200)
HDL: 60.3 mg/dL (ref >39.00)
LDL Cholesterol: 117 mg/dL — ABNORMAL HIGH (ref 0–99)
NonHDL: 146.39
Total CHOL/HDL Ratio: 3
Triglycerides: 145 mg/dL (ref 0.0–149.0)
VLDL: 29 mg/dL (ref 0.0–40.0)

## 2022-04-20 MED ORDER — ATORVASTATIN CALCIUM 40 MG PO TABS: 40.0000 mg | ORAL_TABLET | Freq: Every day | ORAL | 2 refills | Status: DC

## 2022-05-04 IMAGING — CT CT CARDIAC CORONARY ARTERY CALCIUM SCORE
3 series · 14 of 20 positions shown, 16 images · non-contrast
Comparison: None.

CLINICAL DATA: CAD screening, intermediate CAD risk, treadmill
candidate

EXAM:
CT CARDIAC CORONARY ARTERY CALCIUM SCORE
TECHNIQUE: Non-contrast imaging through the heart was performed using
prospective ECG gating. Image post processing was performed on an
independent workstation, allowing for quantitative analysis of the
heart and coronary arteries. Note that this exam targets the heart
and the chest was not imaged in its entirety.

[Series 2: calcium scoring 2.00 qr36 bestdiast 70% hrt calciu · axial · 0.43mm/px · z∈[+1588,+1684]mm · 4 of 80 slices shown]
[im 16/80  vessel]
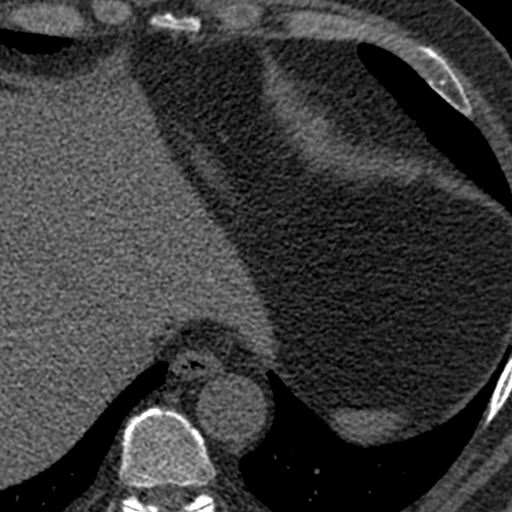
[im 32/80  vessel]
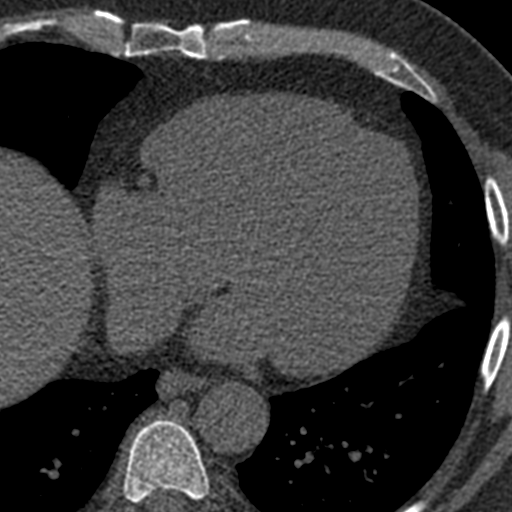
[im 48/80  vessel]
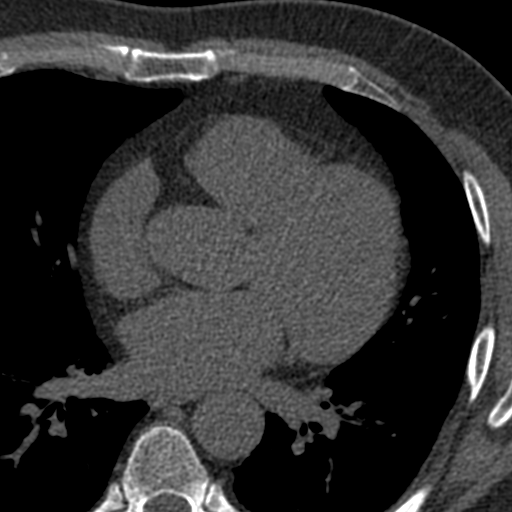
[im 64/80  vessel]
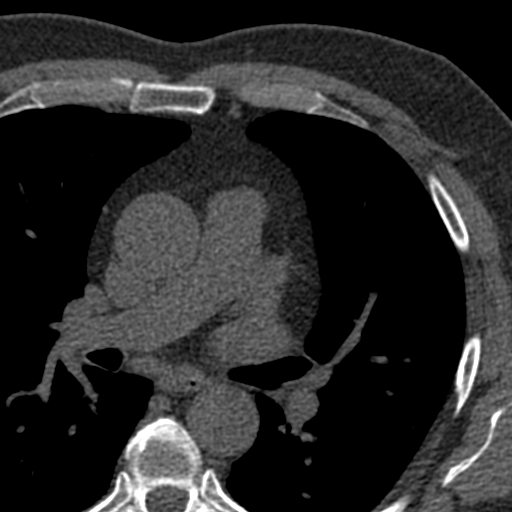

[Series 3: calcium scoring 2.00 br40 bestdiast 70% axial · axial · 0.65mm/px · z∈[+1584,+1688]mm · 5 of 80 slices shown, 7 images]
[im 14/80  vessel]
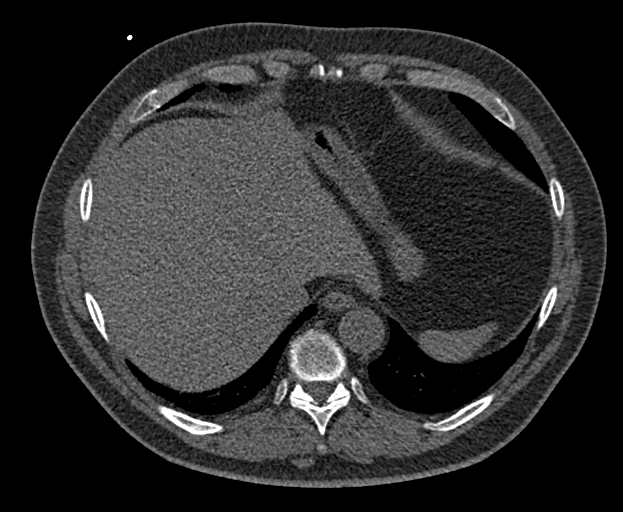
[im 14/80  lung]
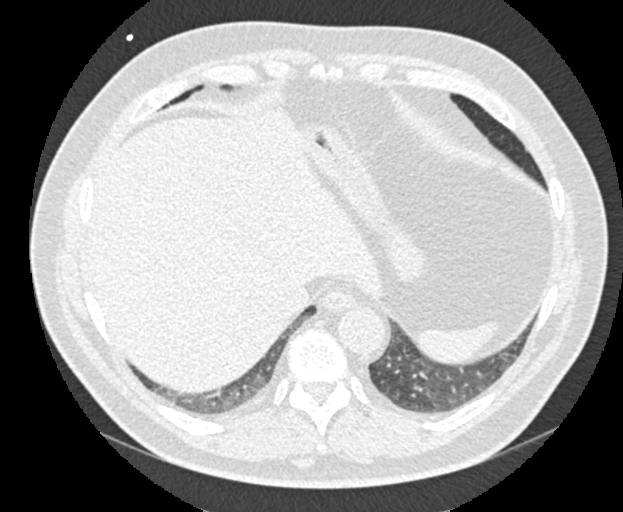
[im 27/80  vessel]
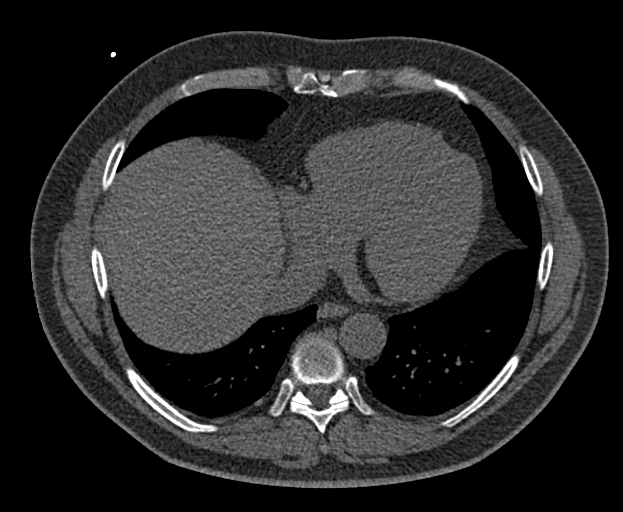
[im 40/80  vessel]
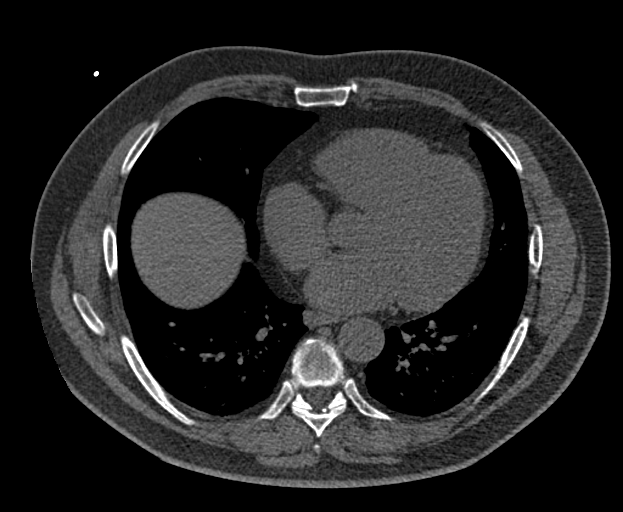
[im 53/80  vessel]
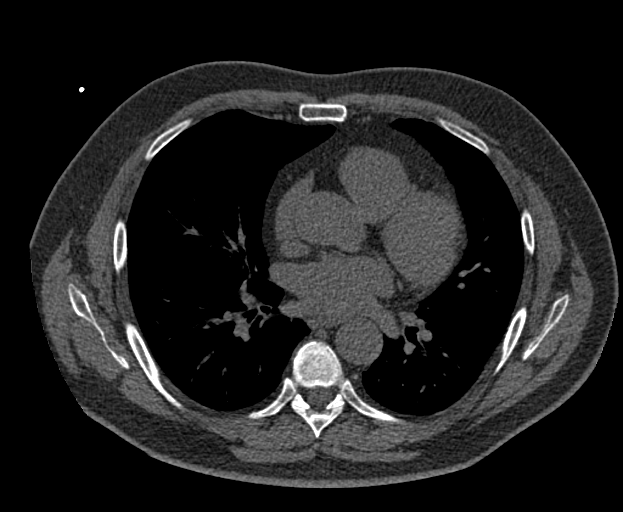
[im 66/80  vessel]
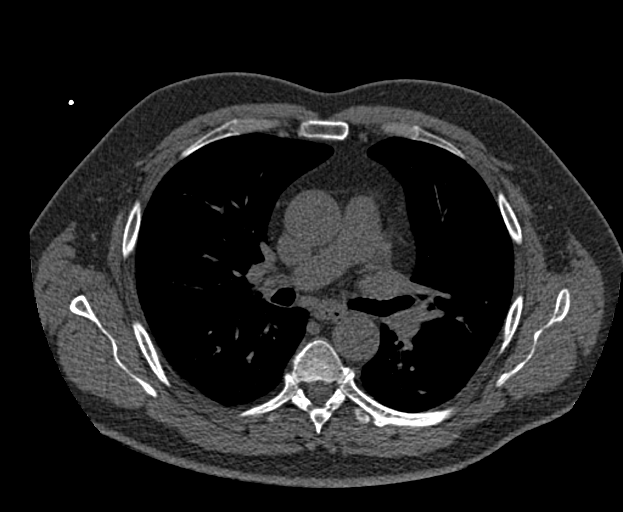
[im 66/80  lung]
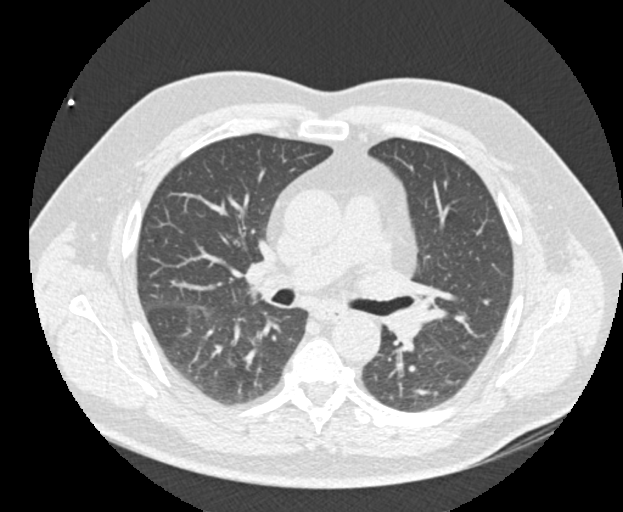

[Series 9: calcium scoring 2.00 br60 bestdiast 70% lungs · axial · 0.65mm/px · z∈[+1584,+1688]mm · 5 of 80 slices shown]
[im 14/80  vessel]
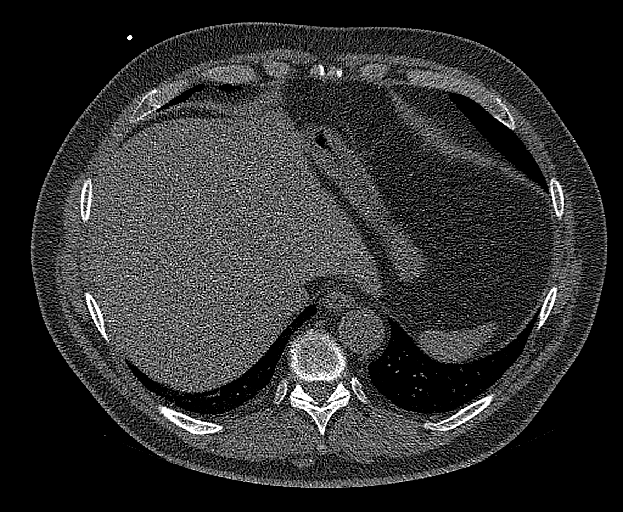
[im 27/80  vessel]
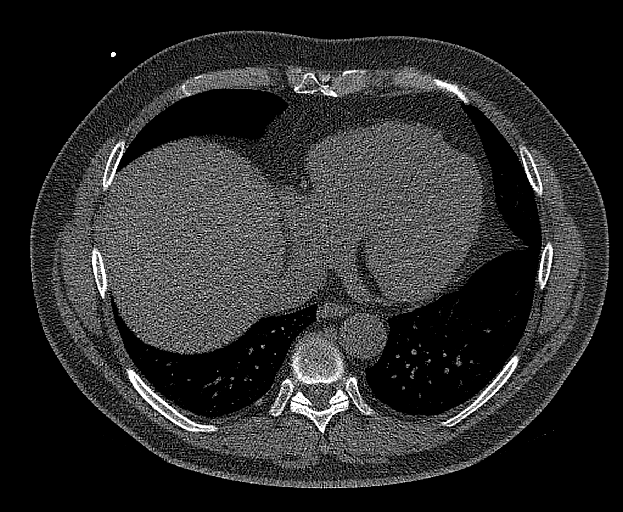
[im 40/80  vessel]
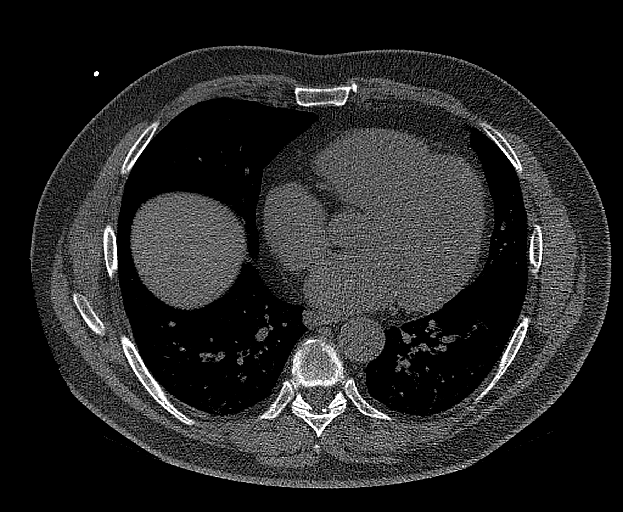
[im 53/80  vessel]
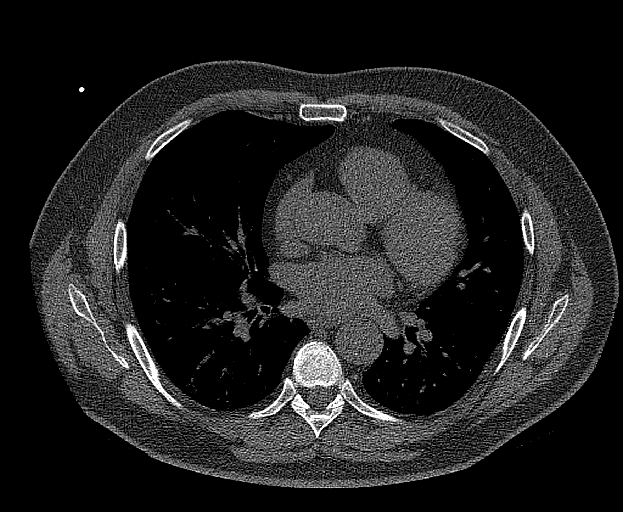
[im 66/80  vessel]
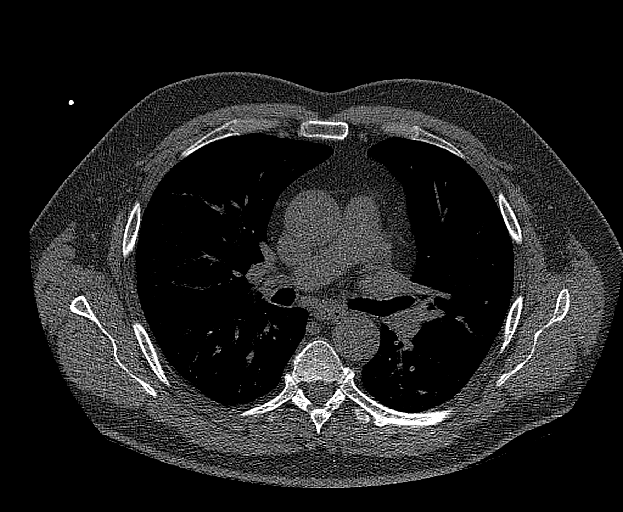

[14 of 20 positions shown; findings below may reference images not displayed]

FINDINGS: CORONARY CALCIUM SCORES:

Left Main: 0

LAD: 6

LCx: 0

RCA: 0

Total Agatston Score: 6

[HOSPITAL] percentile: 27

AORTA MEASUREMENTS:

Ascending Aorta: 37 mm

Descending Aorta: 28 mm

OTHER FINDINGS:

Heart is normal size. Aorta normal caliber. Scattered aortic
calcifications. No adenopathy. No confluent opacities or effusions.
Imaging into the upper abdomen demonstrates no acute findings. Chest
wall soft tissues are unremarkable. No acute bony abnormality.
IMPRESSION: Total Agatston score: 6

[HOSPITAL] percentile: 27

Scattered aortic atherosclerosis.

No acute extra cardiac abnormality.

## 2022-08-03 DIAGNOSIS — L570 Actinic keratosis: Secondary | ICD-10-CM | POA: Diagnosis not present

## 2022-08-03 DIAGNOSIS — L821 Other seborrheic keratosis: Secondary | ICD-10-CM | POA: Diagnosis not present

## 2022-08-03 DIAGNOSIS — L578 Other skin changes due to chronic exposure to nonionizing radiation: Secondary | ICD-10-CM | POA: Diagnosis not present

## 2022-08-03 DIAGNOSIS — D225 Melanocytic nevi of trunk: Secondary | ICD-10-CM | POA: Diagnosis not present

## 2022-08-15 ENCOUNTER — Other Ambulatory Visit: Payer: Self-pay

## 2022-08-15 DIAGNOSIS — I10 Essential (primary) hypertension: Secondary | ICD-10-CM

## 2022-09-19 ENCOUNTER — Encounter: Payer: Self-pay | Admitting: *Deleted

## 2022-09-19 ENCOUNTER — Telehealth: Payer: Self-pay | Admitting: *Deleted

## 2022-09-19 NOTE — Patient Outreach (Signed)
  Care Coordination   Initial Visit Note   09/19/2022 Name: Ralph Hayes MRN: [id number] DOB: [birth year]  Ralph Hayes is a 66 y.o. year old male who sees [name], MD for primary care. I spoke with  Ralph Hayes by phone today.  What matters to the patients health and wellness today?  No needs    Goals Addressed               This Visit's Progress     COMPLETED: No needs (pt-stated)        Care Coordination Interventions: Reviewed medications with patient and discussed adherence with no needed refills Reviewed scheduled/upcoming provider appointments including sufficient transportation Assessed social determinant of health barriers         SDOH assessments and interventions completed:  Yes  SDOH Interventions Today    Flowsheet Row Most Recent Value  SDOH Interventions   Food Insecurity Interventions Intervention Not Indicated  Housing Interventions Intervention Not Indicated  Transportation Interventions Intervention Not Indicated  Utilities Interventions Intervention Not Indicated        Care Coordination Interventions Activated:  Yes  Care Coordination Interventions:  Yes, provided   Follow up plan: No further intervention required.   Encounter Outcome:  Pt. Visit Completed   Raina Mina, RN Care Management Coordinator [place] Office [phone]

## 2022-09-19 NOTE — Patient Instructions (Signed)
Visit Information  Thank you for taking time to visit with me today. Please don't hesitate to contact me if I can be of assistance to you.   Following are the goals we discussed today:   Goals Addressed               This Visit's Progress     COMPLETED: No needs (pt-stated)        Care Coordination Interventions: Reviewed medications with patient and discussed adherence with no needed refills Reviewed scheduled/upcoming provider appointments including sufficient transportation Assessed social determinant of health barriers        Please call the care guide team at [phone] if you need to cancel or reschedule your appointment.   If you are experiencing a Mental Health or [place] or need someone to talk to, please call the Suicide and Crisis Lifeline: 988  Patient verbalizes understanding of instructions and care plan provided today and agrees to view in [facility]. Active MyChart status and patient understanding of how to access instructions and care plan via MyChart confirmed with patient.     No further follow up required: No needs  Raina Mina, RN Care Management Coordinator [place] Office [phone]

## 2022-10-13 ENCOUNTER — Telehealth: Payer: Federal, State, Local not specified - PPO | Admitting: Nurse Practitioner

## 2022-10-13 DIAGNOSIS — S8012XA Contusion of left lower leg, initial encounter: Secondary | ICD-10-CM

## 2022-10-13 NOTE — Patient Instructions (Signed)
  Ralph Hayes, thank you for joining Chevis Pretty, FNP for today's virtual visit.  While this provider is not your primary care provider (PCP), if your PCP is located in our provider database this encounter information will be shared with them immediately following your visit.   [facility] account gives you access to today's visit and all your visits, tests, and labs performed at [facility] " click here if you don't have a [facility] account or go to [email or web address]  Consent: (Patient) Ralph Hayes provided verbal consent for this virtual visit at the beginning of the encounter.  Current Medications:  Current Outpatient Medications:    atorvastatin (LIPITOR) 40 MG tablet, Take 1 tablet (40 mg total) by mouth daily., Disp: 90 tablet, Rfl: 2   co-enzyme Q-10 50 MG capsule, Take 300 mg by mouth daily., Disp: , Rfl:    Medications ordered in this encounter:  No orders of the defined types were placed in this encounter.    *If you need refills on other medications prior to your next appointment, please contact your pharmacy*  Follow-Up: Call back or seek an in-person evaluation if the symptoms worsen or if the condition fails to improve as anticipated.  [place] [phone]  Other Instructions [facility]'signs of infection reviewd   If you have been instructed to have an in-person evaluation today at a local Urgent Care facility, please use the link below. It will take you to a list of all of our available [facility], including address, phone number and hours of operation. Please do not delay care.  [facility]  If you or a family member do not have a primary care provider, use the link below to schedule a visit and establish care. When you choose a [facility] primary care physician or advanced practice provider, you gain a long-term partner in health. Find a Primary Care  Provider  Learn more about [facility] in-office and virtual care options: [facility]

## 2022-10-13 NOTE — Progress Notes (Signed)
Virtual Visit Consent   [name], you are scheduled for a virtual visit with Mary-Margaret Hassell Done, [place], a [facility] provider, today.     Just as with appointments in the office, your consent must be obtained to participate.  Your consent will be active for this visit and any virtual visit you may have with one of our providers in the next 365 days.     If you have a MyChart account, a copy of this consent can be sent to you electronically.  All virtual visits are billed to your insurance company just like a traditional visit in the office.    As this is a virtual visit, video technology does not allow for your provider to perform a traditional examination.  This may limit your provider's ability to fully assess your condition.  If your provider identifies any concerns that need to be evaluated in person or the need to arrange testing (such as labs, EKG, etc.), we will make arrangements to do so.     Although advances in technology are sophisticated, we cannot ensure that it will always work on either your end or our end.  If the connection with a video visit is poor, the visit may have to be switched to a telephone visit.  With either a video or telephone visit, we are not always able to ensure that we have a secure connection.     I need to obtain your verbal consent now.   Are you willing to proceed with your visit today? YES   [name] has provided verbal consent on 10/13/2022 for a virtual visit (video or telephone).   Mary-Margaret Hassell Done, FNP   Date: 10/13/2022 5:01 PM   Virtual Visit via Video Note   I, Mary-Margaret Hassell Done, connected with Ralph Hayes ([id number], Nov 22, 1955) on 10/13/22 at  5:15 PM EST by a video-enabled telemedicine application and verified that I am speaking with the correct person using two identifiers.  Location: Patient: Virtual Visit Location Patient: Home Provider: Virtual Visit Location Provider: Mobile   I discussed the limitations  of evaluation and management by telemedicine and the availability of in person appointments. The patient expressed understanding and agreed to proceed.    History of Present Illness: Ralph Hayes is a 66 y.o. who identifies as a male who was assigned male at birth, and is being seen today for contusion.  HPI: Patient stepped into his tub about 2 hours ago and his foot slipped and he hit his left shin on bathtub. Rates pain 2/10. Hurts worse when touching it.    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 Childhood reaction,   Medications:  Current Outpatient Medications:    atorvastatin (LIPITOR) 40 MG  tablet, Take 1 tablet (40 mg total) by mouth daily., Disp: 90 tablet, Rfl: 2   co-enzyme Q-10 50 MG capsule, Take 300 mg by mouth daily., Disp: , Rfl:   Observations/Objective: Patient is well-developed, well-nourished in no acute distress.  Resting comfortably  at home.  Head is normocephalic, atraumatic.  No labored breathing.  Speech is clear and coherent with logical content.  Patient is alert and oriented at baseline.  10cm raised contusion on left mid  shin  Assessment and Plan:  [name] in today with chief complaint of No chief complaint on file.   1. Contusion of left lower leg, initial encounter Ice Elevate If not improving NTBS Signs of infection discussed    Follow Up Instructions: I discussed the assessment and treatment plan with the patient. The patient was provided an opportunity to ask questions and all were answered. The patient agreed with the plan and demonstrated an understanding of the instructions.  A copy of instructions were sent to the patient via MyChart.  The patient was advised to call back or seek an in-person evaluation if the symptoms worsen or if the condition fails to improve as anticipated.  Time:  I spent 5 minutes with the patient via telehealth technology discussing the above problems/concerns.    Mary-Margaret Hassell Done, FNP

## 2022-10-15 ENCOUNTER — Telehealth: Payer: Self-pay | Admitting: Family Medicine

## 2022-10-15 NOTE — Telephone Encounter (Signed)
Initial Comment Caller states that her husband fell and has a huge lump on his leg. Caller states that it is the size of an apple. Ong Not Listed [facility] Translation No Nurse Assessment Nurse: Surprenant, RN, Monique Date/Time (Eastern Time): 10/13/2022 3:07:36 PM Confirm and document reason for call. If symptomatic, describe symptoms. ---Caller states that her husband fell and has a huge lump on his leg. Caller states that it is the size of an apple. Fell within last 30 min. Inside left leg/shin. Color is purple/red/blue. Swelled up immediately. Slipped getting into bathtub. Did not hit head. Not on blood thinners. Does the patient have any new or worsening symptoms? ---Yes Will a triage be completed? ---Yes Related visit to physician within the last 2 weeks? ---No Does the PT have any chronic conditions? (i.e. diabetes, asthma, this includes High risk factors for pregnancy, etc.) ---Yes List chronic conditions. ---High cholesterol Is this a behavioral health or substance abuse call? ---No PLEASE NOTE: All timestamps contained within this report are represented as [place] Standard Time. CONFIDENTIALTY NOTICE: This fax transmission is intended only for the addressee. It contains information that is legally privileged, confidential or otherwise protected from use or disclosure. If you are not the intended recipient, you are strictly prohibited from reviewing, disclosing, copying using or disseminating any of this information or taking any action in reliance on or regarding this information. If you have received this fax in error, please notify us immediately by telephone so that we can arrange for its return to [place]. Phone: [phone], Toll-Free: [phone], Fax: [phone] Page: 2 of 2 Call Id: [id number] Guidelines Guideline Title Affirmed Question Affirmed Notes Nurse Date/Time Eilene Ghazi Time) Leg Injury Large swelling or bruise > 2 inches (5 cm) Surprenant,  RN, Monique 10/13/2022 3:09:53 PM Disp. Time Eilene Ghazi Time) Disposition Final User 10/13/2022 3:12:57 PM See PCP within 24 Hours Yes Surprenant, RN, Beckie Busing Final Disposition 10/13/2022 3:12:57 PM See PCP within 24 Hours Yes Surprenant, RN, Beckie Busing Caller Disagree/Comply Comply Caller Understands Yes PreDisposition Did not know what to do Care Advice Given Per Guideline USE A COLD PACK FOR PAIN, SWELLING, OR BRUISING: * Put a cold pack or an ice bag (wrapped in a moist towel) on the area for 20 minutes. * Repeat in 1 hour, then every 4 hours while awake. * Continue this for the first 48 hours (2 days) after an injury. * This will help decrease pain, swelling, and bruising. * ACETAMINOPHEN - EXTRA STRENGTH TYLENOL: Take 1,000 mg (two 500 mg pills) every 6 to 8 hours as needed. Each Extra Strength Tylenol pill has 500 mg of acetaminophen. The most you should take is 6 pills a day (3,000 mg total). Note: In [place], the maximum is 8 pills a day (4,000 mg total). * IBUPROFEN (E.G., MOTRIN, ADVIL): Take 400 mg (two 200 mg pills) by mouth every 6 hours. The most you should take is 6 pills a day (1,200 mg total). * For pain relief, you can take either acetaminophen, ibuprofen, or naproxen. CALL BACK IF: * Pain becomes severe * You become worse SEE PCP WITHIN 24 HOURS: Referrals GO TO FACILITY OTHER - SPECIFY Called patient spoke to patient. Patient states that the swollen has went down. Patient states that he doesn't need an appt and will call back if anything changes.

## 2022-10-15 NOTE — Telephone Encounter (Signed)
Patient was seen for this later in the day 10/13/22

## 2022-11-21 ENCOUNTER — Encounter: Payer: Self-pay | Admitting: Family Medicine

## 2022-11-21 ENCOUNTER — Ambulatory Visit (INDEPENDENT_AMBULATORY_CARE_PROVIDER_SITE_OTHER): Payer: Federal, State, Local not specified - PPO | Admitting: Family Medicine

## 2022-11-21 VITALS — BP 120/68 | HR 85 | Temp 98.4°F | Ht 72.0 in | Wt 231.4 lb

## 2022-11-21 DIAGNOSIS — Z23 Encounter for immunization: Secondary | ICD-10-CM

## 2022-11-21 DIAGNOSIS — Z13220 Encounter for screening for lipoid disorders: Secondary | ICD-10-CM

## 2022-11-21 DIAGNOSIS — Z0000 Encounter for general adult medical examination without abnormal findings: Secondary | ICD-10-CM | POA: Diagnosis not present

## 2022-11-21 DIAGNOSIS — Z131 Encounter for screening for diabetes mellitus: Secondary | ICD-10-CM | POA: Diagnosis not present

## 2022-11-21 DIAGNOSIS — N400 Enlarged prostate without lower urinary tract symptoms: Secondary | ICD-10-CM

## 2022-11-21 DIAGNOSIS — E785 Hyperlipidemia, unspecified: Secondary | ICD-10-CM

## 2022-11-21 DIAGNOSIS — Z125 Encounter for screening for malignant neoplasm of prostate: Secondary | ICD-10-CM | POA: Diagnosis not present

## 2022-11-21 DIAGNOSIS — R739 Hyperglycemia, unspecified: Secondary | ICD-10-CM

## 2022-11-21 LAB — LIPID PANEL
Cholesterol: 196 mg/dL (ref 0–200)
HDL: 52.5 mg/dL (ref >39.00)
LDL Cholesterol: 109 mg/dL — ABNORMAL HIGH (ref 0–99)
NonHDL: 143.15
Total CHOL/HDL Ratio: 4
Triglycerides: 173 mg/dL — ABNORMAL HIGH (ref 0.0–149.0)
VLDL: 34.6 mg/dL (ref 0.0–40.0)

## 2022-11-21 LAB — COMPREHENSIVE METABOLIC PANEL
ALT: 44 U/L (ref 0–53)
AST: 25 U/L (ref 0–37)
Albumin: 4.4 g/dL (ref 3.5–5.2)
Alkaline Phosphatase: 79 U/L (ref 39–117)
BUN: 21 mg/dL (ref 6–23)
CO2: 30 mEq/L (ref 19–32)
Calcium: 9.2 mg/dL (ref 8.4–10.5)
Chloride: 104 mEq/L (ref 96–112)
Creatinine, Ser: 0.96 mg/dL (ref 0.40–1.50)
GFR: 82.78 mL/min (ref >60.00)
Glucose, Bld: 103 mg/dL — ABNORMAL HIGH (ref 70–99)
Potassium: 4.3 mEq/L (ref 3.5–5.1)
Sodium: 142 mEq/L (ref 135–145)
Total Bilirubin: 1.7 mg/dL — ABNORMAL HIGH (ref 0.2–1.2)
Total Protein: 6.5 g/dL (ref 6.0–8.3)

## 2022-11-21 LAB — PSA: PSA: 4.05 ng/mL — ABNORMAL HIGH (ref 0.10–4.00)

## 2022-11-21 LAB — HEMOGLOBIN A1C: Hgb A1c MFr Bld: 5.6 % (ref 4.6–6.5)

## 2022-11-21 MED ORDER — ATORVASTATIN CALCIUM 40 MG PO TABS: 40.0000 mg | ORAL_TABLET | Freq: Every day | ORAL | 2 refills | Status: DC

## 2022-11-21 NOTE — Progress Notes (Unsigned)
Subjective:  Patient ID: Ralph Hayes, male    DOB: Feb 26, 1956  Age: 67 y.o. MRN: [id number]  CC:  Chief Complaint  Patient presents with   Annual Exam    Pt is fasting and doing well     HPI MYCHEAL Hayes presents for annual exam.  Still working. Considering retirement in April.   Hyperlipidemia: Treated with Lipitor 40 mg daily, coenzyme Q10 on and off.  No new side effects/myalgias.  Lab Results  Component Value Date   CHOL 207 (H) 04/20/2022   HDL 60.30 04/20/2022   LDLCALC 117 (H) 04/20/2022   TRIG 145.0 04/20/2022   CHOLHDL 3 04/20/2022   Lab Results  Component Value Date   ALT 35 04/20/2022   AST 21 04/20/2022   ALKPHOS 88 04/20/2022   BILITOT 1.6 (H) 04/20/2022   Cardiac Cardiologist Dr. Virgina Jock Last visit last year.  History of mild mitral regurgitation.  Stress test 10.1 METS, 96% age-predicted maximal heart rate using Bruce protocol and February 2023.  Low risk study, no ischemic changes.  Echo February 2023 with EF 45 to 50%, mild global hypokinesis.  Mild, grade 1 mitral regurgitation, mild tricuspid regurgitation.  New mild LVEF reduction, previously noted to be 66%.  Coronary calcium scoring on December 15, 2021, total score of 6 (LAD), 27th percentile, scattered aortic atherosclerosis. Planned repeat echo - appears to be scheduled 12/17/22.  No dyspnea. No new chest pain.       11/21/2022    8:43 AM 04/20/2022    8:39 AM 10/18/2021    8:30 AM 04/17/2021    9:25 AM 10/12/2020    9:43 AM  Depression screen PHQ 2/9  Decreased Interest 0 0 0 0 0  Down, Depressed, Hopeless 0 0 0 0 0  PHQ - 2 Score 0 0 0 0 0  Altered sleeping 0  0 0   Tired, decreased energy 0  0 0   Change in appetite 0  0 0   Feeling bad or failure about yourself  0  0 0   Trouble concentrating 0  0 0   Moving slowly or fidgety/restless 0  0 0   Suicidal thoughts 0  0 0   PHQ-9 Score 0  0 0   Difficult doing work/chores   Not difficult at all      Health Maintenance   Topic Date Due   INFLUENZA VACCINE  01/27/2023 (Originally 05/29/2022)   Pneumonia Vaccine 69+ Years old (1 - PCV) 04/21/2023 (Originally 01/31/2022)   COLONOSCOPY (Pts 45-76yr Insurance coverage will need to be confirmed)  05/13/2023   DTaP/Tdap/Td (3 - Td or Tdap) 02/28/2028   Hepatitis C Screening  Completed   HIV Screening  Completed   Zoster Vaccines- Shingrix  Completed   HPV VACCINES  Aged Out   COVID-19 Vaccine  Discontinued  Colonoscopy 2019 - repeat 5 years - due in July - Dr. PHenrene Pastor  Prostate: saw urology in April last year, Dr. EJunious Silk PSA down from 3.26 to 3.20, planned 1 year recheck. BPH w/o LUTS.  Requests psa today.  Lab Results  Component Value Date   PSA1 3.8 03/29/2020   PSA1 3.4 09/01/2018   PSA1 2.9 09/12/2017   PSA 4.77 (H) 04/17/2021   PSA 2.52 03/08/2016   PSA 2.56 04/07/2015    Immunization History  Administered Date(s) Administered   Influenza Inj Mdck Quad Pf 08/20/2018   Influenza Split 08/05/2012   Influenza,inj,Quad PF,6+ Mos 09/21/2014, 10/08/2016, 09/12/2017   Influenza-Unspecified 09/12/2020,  09/05/2021   Moderna Sars-Covid-2 Vaccination 01/04/2020, 02/01/2020   PFIZER(Purple Top)SARS-COV-2 Vaccination 10/02/2020   Tdap 11/25/2007, 02/27/2018   Varicella 11/25/2007   Zoster Recombinat (Shingrix) 03/03/2018, 07/26/2018   Zoster, Live 05/06/2014  Flu vaccine and covid booster recently from [facility].  Pneumonia vaccine - prevnar 20 today.   No results found. Glasses, optho 1 year ago.   Dental:Within Last 6 months, recent visit.   Alcohol: wine - up to 5 glasses per week.   Tobacco: none.   Exercise: less recently. Some dancing. Plans to increase. Recent cruise.  Wt Readings from Last 3 Encounters:  11/21/22 231 lb 6.4 oz (105 kg)  04/20/22 227 lb (103 kg)  11/23/21 232 lb (105.2 kg)  Body mass index is 31.38 kg/m.  Hyperglycemia/prediabetes.  Glucose 108 in June 2023.  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   Bradycardia 05/04/2014   Past Medical History:  Diagnosis Date   Hyperlipidemia    Medical history non-contributory    Umbilical hernia    Past Surgical History:  Procedure Laterality Date   COLONOSCOPY     EYE SURGERY N/A    [facility] 03/29/2020   HERNIA REPAIR N/A    [facility] 03/29/2020   NO PAST SURGERIES     UMBILICAL HERNIA REPAIR N/A 12/28/2016   Procedure: LAPAROSCOPIC UMBILICAL HERNIA;  Surgeon: Alphonsa Overall, MD;  Location: [facility];  Service: General;  Laterality: N/A;   UMBILICAL HERNIA REPAIR N/A 12/18/2018   Procedure: LAPAROSCOPIC EXPLORATION  AND REPAIR OF UMBILICAL HERNIA ERAS PATHWAY;  Surgeon: Alphonsa Overall, MD;  Location: [facility];  Service: General;  Laterality: N/A;   Allergies  Allergen Reactions   Penicillins Swelling    SWELLING REACTION UNSPECIFIED   Has patient had a PCN reaction causing immediate rash, facial/tongue/throat swelling, SOB or lightheadedness with hypotension:unsure Has patient had a PCN reaction causing severe rash involving mucus membranes or skin necrosis:unsure Has patient had a PCN reaction that required hospitalization:No Has patient had a PCN reaction occurring within the last 10 years:unsure If all of the above answers are "NO", then may proceed with Cephalosporin use. Childhood reaction,   Prior to Admission medications   Medication Sig Start Date End Date Taking? Authorizing Provider  atorvastatin (LIPITOR) 40 MG tablet Take 1 tablet (40 mg total) by mouth daily. 04/20/22  Yes [name], MD  co-enzyme Q-10 50 MG capsule Take 300 mg by mouth daily.   Yes [provider]   Social History    Socioeconomic History   Marital status: Married    Spouse name: Not on file   Number of children: 2   Years of education: Not on file   Highest education level: Not on file  Occupational History   Not on file  Tobacco Use   Smoking status: Never   Smokeless tobacco: Never  Vaping Use   Vaping Use: Never used  Substance and Sexual Activity   Alcohol use: Yes    Alcohol/week: 2.0 standard drinks of alcohol    Types: 2 Glasses of wine per week    Comment: 2-3 glasses 1-2  times a week   Drug use: No   Sexual activity: Yes  Other Topics Concern   Not on file  Social History Narrative   Married. Education: [employer].    Social Determinants of Health   Financial Resource Strain: Not on file  Food Insecurity: No Food Insecurity (09/19/2022)   Hunger Vital Sign    Worried About Running Out of Food in the Last Year: Never true    Ran Out of Food in the Last Year: Never true  Transportation Needs: No Transportation Needs (09/19/2022)   PRAPARE - Hydrologist (Medical): No    Lack of Transportation (Non-Medical): No  Physical Activity: Not on file  Stress: Not on file  Social Connections: Not on file  Intimate Partner Violence: Not on file    Review of Systems 13 point review of systems per patient health survey noted.  Negative other than as indicated above or in HPI.    Objective:   Vitals:   11/21/22 0843  BP: 120/68  Pulse: 85  Temp: 98.4 F (36.9 C)  TempSrc: Temporal  SpO2: 94%  Weight: 231 lb 6.4 oz (105 kg)  Height: 6' (1.829 m)   {Vitals History (Optional):23777}  Physical Exam Vitals reviewed.  Constitutional:      Appearance: He is well-developed.  HENT:     Head: Normocephalic and atraumatic.     Right Ear: External ear normal.     Left Ear: External ear normal.  Eyes:     Conjunctiva/sclera: Conjunctivae normal.     Pupils: Pupils are equal, round, and reactive to light.  Neck:     Thyroid: No thyromegaly.   Cardiovascular:     Rate and Rhythm: Normal rate and regular rhythm.     Heart sounds: Normal heart sounds.  Pulmonary:     Effort: Pulmonary effort is normal. No respiratory distress.     Breath sounds: Normal breath sounds. No wheezing.  Abdominal:     General: There is no distension.     Palpations: Abdomen is soft.     Tenderness: There is no abdominal tenderness.  Musculoskeletal:        General: No tenderness. Normal range of motion.     Cervical back: Normal range of motion and neck supple.  Lymphadenopathy:     Cervical: No cervical adenopathy.  Skin:    General: Skin is warm and dry.  Neurological:     Mental Status: He is alert and oriented to person, place, and time.     Deep Tendon Reflexes: Reflexes are normal and symmetric.  Psychiatric:        Behavior: Behavior normal.        Assessment & Plan:  BROWNIE NEHME is a 67 y.o. male . Annual physical exam - Plan: Comprehensive metabolic panel, Lipid panel, Hemoglobin A1c  Hyperlipidemia, unspecified hyperlipidemia type - Plan: atorvastatin (LIPITOR) 40 MG tablet  Screening for prostate cancer - Plan: PSA  Screening for diabetes mellitus - Plan: Comprehensive metabolic panel, Hemoglobin A1c  Screening for hyperlipidemia - Plan: Comprehensive metabolic panel, Lipid panel  Benign prostatic hyperplasia, unspecified whether lower urinary tract symptoms present - Plan: PSA  Need for pneumococcal vaccine - Plan: Pneumococcal conjugate vaccine 20-valent (Prevnar 20)  Hyperglycemia - Plan: Hemoglobin A1c   Meds ordered this encounter  Medications   atorvastatin (LIPITOR) 40 MG tablet    Sig: Take 1 tablet (40 mg total) by mouth daily.    Dispense:  90 tablet  Refill:  2   Patient Instructions  Thanks for coming in today.  I will check labs as we discussed.  If PSA is elevated, can discuss with urology as planned.  Start low, go slow with increase in exercise with goal of 150 minutes/week.  I will check  blood sugar but that has been slightly high in the past and borderline prediabetes.  No change in cholesterol medicine for now.  It appears he did have an echocardiogram planned next month, but can discuss with cardiology.  Let me know if there are questions and take care!  Preventive Care 57 Years and Older, Male Preventive care refers to lifestyle choices and visits with your health care provider that can promote health and wellness. Preventive care visits are also called wellness exams. What can I expect for my preventive care visit? Counseling During your preventive care visit, your health care provider may ask about your: Medical history, including: Past medical problems. Family medical history. History of falls. Current health, including: Emotional well-being. Home life and relationship well-being. Sexual activity. Memory and ability to understand (cognition). Lifestyle, including: Alcohol, nicotine or tobacco, and drug use. Access to firearms. Diet, exercise, and sleep habits. Work and work Statistician. Sunscreen use. Safety issues such as seatbelt and bike helmet use. Physical exam Your health care provider will check your: Height and weight. These may be used to calculate your BMI (body mass index). BMI is a measurement that tells if you are at a healthy weight. Waist circumference. This measures the distance around your waistline. This measurement also tells if you are at a healthy weight and may help predict your risk of certain diseases, such as type 2 diabetes and high blood pressure. Heart rate and blood pressure. Body temperature. Skin for abnormal spots. What immunizations do I need?  Vaccines are usually given at various ages, according to a schedule. Your health care provider will recommend vaccines for you based on your age, medical history, and lifestyle or other factors, such as travel or where you work. What tests do I need? Screening Your health care provider  may recommend screening tests for certain conditions. This may include: Lipid and cholesterol levels. Diabetes screening. This is done by checking your blood sugar (glucose) after you have not eaten for a while (fasting). Hepatitis C test. Hepatitis B test. HIV (human immunodeficiency virus) test. STI (sexually transmitted infection) testing, if you are at risk. Lung cancer screening. Colorectal cancer screening. Prostate cancer screening. Abdominal aortic aneurysm (AAA) screening. You may need this if you are a current or former smoker. Talk with your health care provider about your test results, treatment options, and if necessary, the need for more tests. Follow these instructions at home: Eating and drinking  Eat a diet that includes fresh fruits and vegetables, whole grains, lean protein, and low-fat dairy products. Limit your intake of foods with high amounts of sugar, saturated fats, and salt. Take vitamin and mineral supplements as recommended by your health care provider. Do not drink alcohol if your health care provider tells you not to drink. If you drink alcohol: Limit how much you have to 0-2 drinks a day. Know how much alcohol is in your drink. In the [place] one drink equals one 12 oz bottle of beer (355 mL), one 5 oz glass of wine (148 mL), or one 1 oz glass of hard liquor (44 mL). Lifestyle Brush your teeth every morning and night with fluoride toothpaste. Floss one time each day. Exercise for at  least 30 minutes 5 or more days each week. Do not use any products that contain nicotine or tobacco. These products include cigarettes, chewing tobacco, and vaping devices, such as e-cigarettes. If you need help quitting, ask your health care provider. Do not use drugs. If you are sexually active, practice safe sex. Use a condom or other form of protection to prevent STIs. Take aspirin only as told by your health care provider. Make sure that you understand how much to take and  what form to take. Work with your health care provider to find out whether it is safe and beneficial for you to take aspirin daily. Ask your health care provider if you need to take a cholesterol-lowering medicine (statin). Find healthy ways to manage stress, such as: Meditation, yoga, or listening to music. Journaling. Talking to a trusted person. Spending time with friends and family. Safety Always wear your seat belt while driving or riding in a vehicle. Do not drive: If you have been drinking alcohol. Do not ride with someone who has been drinking. When you are tired or distracted. While texting. If you have been using any mind-altering substances or drugs. Wear a helmet and other protective equipment during sports activities. If you have firearms in your house, make sure you follow all gun safety procedures. Minimize exposure to UV radiation to reduce your risk of skin cancer. What's next? Visit your health care provider once a year for an annual wellness visit. Ask your health care provider how often you should have your eyes and teeth checked. Stay up to date on all vaccines. This information is not intended to replace advice given to you by your health care provider. Make sure you discuss any questions you have with your health care provider. Document Revised: 04/12/2021 Document Reviewed: 04/12/2021 Elsevier Patient Education  [place],   [name], MD [place], [facility] 11/21/22 9:32 AM

## 2022-11-21 NOTE — Patient Instructions (Signed)
Thanks for coming in today.  I will check labs as we discussed.  If PSA is elevated, can discuss with urology as planned.  Start low, go slow with increase in exercise with goal of 150 minutes/week.  I will check blood sugar but that has been slightly high in the past and borderline prediabetes.  No change in cholesterol medicine for now.  It appears he did have an echocardiogram planned next month, but can discuss with cardiology.  Let me know if there are questions and take care!  Preventive Care 44 Years and Older, Male Preventive care refers to lifestyle choices and visits with your health care provider that can promote health and wellness. Preventive care visits are also called wellness exams. What can I expect for my preventive care visit? Counseling During your preventive care visit, your health care provider may ask about your: Medical history, including: Past medical problems. Family medical history. History of falls. Current health, including: Emotional well-being. Home life and relationship well-being. Sexual activity. Memory and ability to understand (cognition). Lifestyle, including: Alcohol, nicotine or tobacco, and drug use. Access to firearms. Diet, exercise, and sleep habits. Work and work Statistician. Sunscreen use. Safety issues such as seatbelt and bike helmet use. Physical exam Your health care provider will check your: Height and weight. These may be used to calculate your BMI (body mass index). BMI is a measurement that tells if you are at a healthy weight. Waist circumference. This measures the distance around your waistline. This measurement also tells if you are at a healthy weight and may help predict your risk of certain diseases, such as type 2 diabetes and high blood pressure. Heart rate and blood pressure. Body temperature. Skin for abnormal spots. What immunizations do I need?  Vaccines are usually given at various ages, according to a schedule. Your  health care provider will recommend vaccines for you based on your age, medical history, and lifestyle or other factors, such as travel or where you work. What tests do I need? Screening Your health care provider may recommend screening tests for certain conditions. This may include: Lipid and cholesterol levels. Diabetes screening. This is done by checking your blood sugar (glucose) after you have not eaten for a while (fasting). Hepatitis C test. Hepatitis B test. HIV (human immunodeficiency virus) test. STI (sexually transmitted infection) testing, if you are at risk. Lung cancer screening. Colorectal cancer screening. Prostate cancer screening. Abdominal aortic aneurysm (AAA) screening. You may need this if you are a current or former smoker. Talk with your health care provider about your test results, treatment options, and if necessary, the need for more tests. Follow these instructions at home: Eating and drinking  Eat a diet that includes fresh fruits and vegetables, whole grains, lean protein, and low-fat dairy products. Limit your intake of foods with high amounts of sugar, saturated fats, and salt. Take vitamin and mineral supplements as recommended by your health care provider. Do not drink alcohol if your health care provider tells you not to drink. If you drink alcohol: Limit how much you have to 0-2 drinks a day. Know how much alcohol is in your drink. In the [place] one drink equals one 12 oz bottle of beer (355 mL), one 5 oz glass of wine (148 mL), or one 1 oz glass of hard liquor (44 mL). Lifestyle Brush your teeth every morning and night with fluoride toothpaste. Floss one time each day. Exercise for at least 30 minutes 5 or more days each week.  Do not use any products that contain nicotine or tobacco. These products include cigarettes, chewing tobacco, and vaping devices, such as e-cigarettes. If you need help quitting, ask your health care provider. Do not use  drugs. If you are sexually active, practice safe sex. Use a condom or other form of protection to prevent STIs. Take aspirin only as told by your health care provider. Make sure that you understand how much to take and what form to take. Work with your health care provider to find out whether it is safe and beneficial for you to take aspirin daily. Ask your health care provider if you need to take a cholesterol-lowering medicine (statin). Find healthy ways to manage stress, such as: Meditation, yoga, or listening to music. Journaling. Talking to a trusted person. Spending time with friends and family. Safety Always wear your seat belt while driving or riding in a vehicle. Do not drive: If you have been drinking alcohol. Do not ride with someone who has been drinking. When you are tired or distracted. While texting. If you have been using any mind-altering substances or drugs. Wear a helmet and other protective equipment during sports activities. If you have firearms in your house, make sure you follow all gun safety procedures. Minimize exposure to UV radiation to reduce your risk of skin cancer. What's next? Visit your health care provider once a year for an annual wellness visit. Ask your health care provider how often you should have your eyes and teeth checked. Stay up to date on all vaccines. This information is not intended to replace advice given to you by your health care provider. Make sure you discuss any questions you have with your health care provider. Document Revised: 04/12/2021 Document Reviewed: 04/12/2021 Elsevier Patient Education  [place].

## 2022-11-26 ENCOUNTER — Encounter: Payer: Self-pay | Admitting: Internal Medicine

## 2022-12-14 ENCOUNTER — Ambulatory Visit: Payer: Federal, State, Local not specified - PPO | Admitting: *Deleted

## 2022-12-14 VITALS — Ht 72.0 in | Wt 230.0 lb

## 2022-12-14 DIAGNOSIS — Z86010 Personal history of colonic polyps: Secondary | ICD-10-CM

## 2022-12-14 NOTE — Progress Notes (Signed)
No egg or soy allergy known to patient  No issues known to pt with past sedation with any surgeries or procedures Patient denies ever being intubated  No issues with moving head or neck No issues swallowing No FH of Malignant Hyperthermia Pt is not on diet pills Pt is not on  home 02  Pt is not on blood thinners  Pt denies issues with constipation  Pt is not on dialysis Pt denies any upcoming cardiac testing Pt encouraged to use to use Singlecare or Goodrx to reduce cost  Patient's chart reviewed by Ralph Hayes CNRA prior to previsit and patient appropriate for the [facility].  Previsit completed and red dot placed by patient's name on their procedure day (on provider's schedule).  . Visit by phone Instructions reviewed with pt and pt states understanding. Instructed to review again prior to procedure. Pt states they will.  Instructions sent by phone with coupon and my chart

## 2022-12-17 ENCOUNTER — Ambulatory Visit: Payer: Federal, State, Local not specified - PPO

## 2022-12-17 DIAGNOSIS — I10 Essential (primary) hypertension: Secondary | ICD-10-CM | POA: Diagnosis not present

## 2022-12-24 ENCOUNTER — Telehealth: Payer: Self-pay | Admitting: Internal Medicine

## 2022-12-24 ENCOUNTER — Other Ambulatory Visit: Payer: Self-pay

## 2022-12-24 DIAGNOSIS — Z86010 Personal history of colonic polyps: Secondary | ICD-10-CM

## 2022-12-24 MED ORDER — NA SULFATE-K SULFATE-MG SULF 17.5-3.13-1.6 GM/177ML PO SOLN: 1.0000 | Freq: Once | ORAL | 0 refills | Status: AC

## 2022-12-24 NOTE — Telephone Encounter (Signed)
Inbound call from patient stating the pharmacy does not have a prescription for suprep. Patient is requesting prep to be sent to [facility] on [address]. Please advise.

## 2022-12-24 NOTE — Telephone Encounter (Signed)
New rx sent pt made aware

## 2023-01-11 ENCOUNTER — Ambulatory Visit (AMBULATORY_SURGERY_CENTER): Payer: Federal, State, Local not specified - PPO | Admitting: Internal Medicine

## 2023-01-11 ENCOUNTER — Encounter: Payer: Self-pay | Admitting: Internal Medicine

## 2023-01-11 VITALS — BP 133/68 | HR 55 | Temp 98.2°F | Resp 14 | Ht 72.0 in | Wt 230.0 lb

## 2023-01-11 DIAGNOSIS — D123 Benign neoplasm of transverse colon: Secondary | ICD-10-CM

## 2023-01-11 DIAGNOSIS — D122 Benign neoplasm of ascending colon: Secondary | ICD-10-CM | POA: Diagnosis not present

## 2023-01-11 DIAGNOSIS — K635 Polyp of colon: Secondary | ICD-10-CM | POA: Diagnosis not present

## 2023-01-11 DIAGNOSIS — Z86010 Personal history of colonic polyps: Secondary | ICD-10-CM | POA: Diagnosis not present

## 2023-01-11 DIAGNOSIS — Z09 Encounter for follow-up examination after completed treatment for conditions other than malignant neoplasm: Secondary | ICD-10-CM | POA: Diagnosis not present

## 2023-01-11 DIAGNOSIS — Z1211 Encounter for screening for malignant neoplasm of colon: Secondary | ICD-10-CM | POA: Diagnosis not present

## 2023-01-11 MED ORDER — SODIUM CHLORIDE 0.9 % IV SOLN: 500.0000 mL | Freq: Once | INTRAVENOUS | Status: DC

## 2023-01-11 NOTE — Progress Notes (Signed)
A and O x3. Report to RN. Tolerated MAC anesthesia well. 

## 2023-01-11 NOTE — Progress Notes (Signed)
Pt's states no medical or surgical changes since previsit or office visit. 

## 2023-01-11 NOTE — Patient Instructions (Addendum)
-  Handout on polyps, hemorrhoids provided -await pathology results -repeat colonoscopy in 5 years for surveillance recommended.  -Continue present medications   YOU HAD AN ENDOSCOPIC PROCEDURE TODAY AT [facility]:   Refer to the procedure report that was given to you for any specific questions about what was found during the examination.  If the procedure report does not answer your questions, please call your gastroenterologist to clarify.  If you requested that your care partner not be given the details of your procedure findings, then the procedure report has been included in a sealed envelope for you to review at your convenience later.  YOU SHOULD EXPECT: Some feelings of bloating in the abdomen. Passage of more gas than usual.  Walking can help get rid of the air that was put into your GI tract during the procedure and reduce the bloating. If you had a lower endoscopy (such as a colonoscopy or flexible sigmoidoscopy) you may notice spotting of blood in your stool or on the toilet paper. If you underwent a bowel prep for your procedure, you may not have a normal bowel movement for a few days.  Please Note:  You might notice some irritation and congestion in your nose or some drainage.  This is from the oxygen used during your procedure.  There is no need for concern and it should clear up in a day or so.  SYMPTOMS TO REPORT IMMEDIATELY:  Following lower endoscopy (colonoscopy or flexible sigmoidoscopy):  Excessive amounts of blood in the stool  Significant tenderness or worsening of abdominal pains  Swelling of the abdomen that is new, acute  Fever of 100F or higher  For urgent or emergent issues, a gastroenterologist can be reached at any hour by calling [phone]. Do not use MyChart messaging for urgent concerns.    DIET:  We do recommend a small meal at first, but then you may proceed to your regular diet.  Drink plenty of fluids but you should avoid alcoholic  beverages for 24 hours.  ACTIVITY:  You should plan to take it easy for the rest of today and you should NOT DRIVE or use heavy machinery until tomorrow (because of the sedation medicines used during the test).    FOLLOW UP: Our staff will call the number listed on your records the next business day following your procedure.  We will call around 7:15- 8:00 am to check on you and address any questions or concerns that you may have regarding the information given to you following your procedure. If we do not reach you, we will leave a message.     If any biopsies were taken you will be contacted by phone or by letter within the next 1-3 weeks.  Please call us at [phone] if you have not heard about the biopsies in 3 weeks.    SIGNATURES/CONFIDENTIALITY: You and/or your care partner have signed paperwork which will be entered into your electronic medical record.  These signatures attest to the fact that that the information above on your After Visit Summary has been reviewed and is understood.  Full responsibility of the confidentiality of this discharge information lies with you and/or your care-partner.

## 2023-01-11 NOTE — Op Note (Signed)
[facility] Patient Name: Ralph Hayes Procedure Date: 01/11/2023 9:23 AM MRN: [id number] Endoscopist: Docia Chuck. Henrene Pastor , MD, [id number] Age: 67 Referring MD:  Date of Birth: [birth year] Gender: Male Account #: [id number] Procedure:                Colonoscopy with cold snare polypectomy x 3 Indications:              High risk colon cancer surveillance: Personal                            history of non-advanced adenoma. Previous exams                            2009, 2019 Medicines:                Monitored Anesthesia Care Procedure:                Pre-Anesthesia Assessment:                           - Prior to the procedure, a History and Physical                            was performed, and patient medications and                            allergies were reviewed. The patient's tolerance of                            previous anesthesia was also reviewed. The risks                            and benefits of the procedure and the sedation                            options and risks were discussed with the patient.                            All questions were answered, and informed consent                            was obtained. Prior Anticoagulants: The patient has                            taken no anticoagulant or antiplatelet agents. ASA                            Grade Assessment: II - A patient with mild systemic                            disease. After reviewing the risks and benefits,                            the patient was deemed in satisfactory condition to  undergo the procedure.                           After obtaining informed consent, the colonoscope                            was passed under direct vision. Throughout the                            procedure, the patient's blood pressure, pulse, and                            oxygen saturations were monitored continuously. The                            Olympus [id number] SN  [id number] was introduced through                            the anus and advanced to the the cecum, identified                            by appendiceal orifice and ileocecal valve. The                            ileocecal valve, appendiceal orifice, and rectum                            were photographed. The quality of the bowel                            preparation was excellent. The colonoscopy was                            performed without difficulty. The patient tolerated                            the procedure well. The bowel preparation used was                            SUPREP via split dose instruction. Scope In: 9:44:06 AM Scope Out: 9:58:52 AM Scope Withdrawal Time: 0 hours 12 minutes 27 seconds  Total Procedure Duration: 0 hours 14 minutes 46 seconds  Findings:                 Three polyps were found in the transverse colon and                            ascending colon. The polyps were 3 to 4 mm in size.                            These polyps were removed with a cold snare.                            Resection and retrieval were complete.  Internal hemorrhoids were found during                            retroflexion. The hemorrhoids were small.                           The exam was otherwise without abnormality on                            direct and retroflexion views. Complications:            No immediate complications. Estimated blood loss:                            None. Estimated Blood Loss:     Estimated blood loss: none. Impression:               - Three 3 to 4 mm polyps in the transverse colon                            and in the ascending colon, removed with a cold                            snare. Resected and retrieved.                           - Internal hemorrhoids.                           - The examination was otherwise normal on direct                            and retroflexion views. Recommendation:           - Repeat  colonoscopy in 5 years for surveillance.                           - Patient has a contact number available for                            emergencies. The signs and symptoms of potential                            delayed complications were discussed with the                            patient. Return to normal activities tomorrow.                            Written discharge instructions were provided to the                            patient.                           - Resume previous diet.                           -  Continue present medications.                           - Await pathology results. Docia Chuck. Henrene Pastor, MD 01/11/2023 10:04:03 AM This report has been signed electronically.

## 2023-01-11 NOTE — Progress Notes (Signed)
Called to room to assist during endoscopic procedure.  Patient ID and intended procedure confirmed with present staff. Received instructions for my participation in the procedure from the performing physician.  

## 2023-01-11 NOTE — Progress Notes (Signed)
HISTORY OF PRESENT ILLNESS:  Ralph Hayes is a 67 y.o. male with a history of adenomatous colon polyp.  Now for surveillance  REVIEW OF SYSTEMS:  All non-GI ROS negative except for  Past Medical History:  Diagnosis Date   Hyperlipidemia    Medical history non-contributory    Umbilical hernia     Past Surgical History:  Procedure Laterality Date   COLONOSCOPY     EYE SURGERY N/A    [facility] 03/29/2020   HERNIA REPAIR N/A    [facility] 03/29/2020   NO PAST SURGERIES     UMBILICAL HERNIA REPAIR N/A 12/28/2016   Procedure: LAPAROSCOPIC UMBILICAL HERNIA;  Surgeon: Alphonsa Overall, MD;  Location: [facility];  Service: General;  Laterality: N/A;   UMBILICAL HERNIA REPAIR N/A 12/18/2018   Procedure: LAPAROSCOPIC EXPLORATION  AND REPAIR OF UMBILICAL HERNIA ERAS PATHWAY;  Surgeon: Alphonsa Overall, MD;  Location: [facility];  Service: General;  Laterality: N/A;    Social History [name]  reports that he has never smoked. He has never used smokeless tobacco. He reports current alcohol use of about 2.0 standard drinks of alcohol per week. He reports that he does not use drugs.  family history includes COPD in his mother; Cancer in his maternal grandfather, mother, paternal grandfather, and sister; Diabetes in his brother; Heart disease in his paternal grandmother.  Allergies  Allergen Reactions   Penicillins Swelling    SWELLING REACTION UNSPECIFIED   Has patient had a PCN reaction causing immediate rash, facial/tongue/throat swelling, SOB or lightheadedness with hypotension:unsure Has patient had a PCN reaction causing severe rash involving mucus membranes or skin necrosis:unsure Has patient had a PCN reaction that required hospitalization:No Has patient had a PCN reaction occurring within the last 10 years:unsure If all of the above answers are "NO", then may proceed with Cephalosporin use. Childhood reaction,       PHYSICAL EXAMINATION: Vital signs: BP 119/67   Pulse (!) 55   Temp  98.2 F (36.8 C)   Ht 6' (1.829 m)   Wt 230 lb (104.3 kg)   SpO2 95%   BMI 31.19 kg/m  General: Well-developed, well-nourished, no acute distress HEENT: Sclerae are anicteric, conjunctiva pink. Oral mucosa intact Lungs: Clear Heart: Regular Abdomen: soft, nontender, nondistended, no obvious ascites, no peritoneal signs, normal bowel sounds. No organomegaly. Extremities: No edema Psychiatric: alert and oriented x3. Cooperative     ASSESSMENT:  History of adenomatous polyps   PLAN:  Surveillance colonoscopy

## 2023-01-14 ENCOUNTER — Telehealth: Payer: Self-pay | Admitting: *Deleted

## 2023-01-14 NOTE — Telephone Encounter (Addendum)
Inbound call from patient states he is feeling fine.  Please advise if any further questions.

## 2023-01-14 NOTE — Telephone Encounter (Signed)
Attempted to call patient for their post-procedure follow-up call. No answer. Unable to leave voicemail due to full mailbox. Please call the [facility] if you have any questions or concerns following your procedure from last week.

## 2023-01-22 ENCOUNTER — Encounter: Payer: Self-pay | Admitting: Internal Medicine

## 2023-02-11 DIAGNOSIS — N400 Enlarged prostate without lower urinary tract symptoms: Secondary | ICD-10-CM | POA: Diagnosis not present

## 2023-02-28 ENCOUNTER — Emergency Department (HOSPITAL_BASED_OUTPATIENT_CLINIC_OR_DEPARTMENT_OTHER)
Admission: EM | Admit: 2023-02-28 | Discharge: 2023-02-28 | Disposition: A | Discharge status: Home / Self Care | Payer: Federal, State, Local not specified - PPO | Attending: Emergency Medicine | Admitting: Emergency Medicine

## 2023-02-28 ENCOUNTER — Emergency Department (HOSPITAL_BASED_OUTPATIENT_CLINIC_OR_DEPARTMENT_OTHER): Payer: Federal, State, Local not specified - PPO | Admitting: Radiology

## 2023-02-28 ENCOUNTER — Encounter (HOSPITAL_BASED_OUTPATIENT_CLINIC_OR_DEPARTMENT_OTHER): Payer: Self-pay | Admitting: *Deleted

## 2023-02-28 ENCOUNTER — Other Ambulatory Visit: Payer: Self-pay

## 2023-02-28 DIAGNOSIS — R079 Chest pain, unspecified: Secondary | ICD-10-CM

## 2023-02-28 DIAGNOSIS — R0789 Other chest pain: Secondary | ICD-10-CM | POA: Diagnosis not present

## 2023-02-28 LAB — CBC
HCT: 46.6 % (ref 39.0–52.0)
Hemoglobin: 15.2 g/dL (ref 13.0–17.0)
MCH: 29.6 pg (ref 26.0–34.0)
MCHC: 32.6 g/dL (ref 30.0–36.0)
MCV: 90.8 fL (ref 80.0–100.0)
Platelets: 221 10*3/uL (ref 150–400)
RBC: 5.13 MIL/uL (ref 4.22–5.81)
RDW: 13.4 % (ref 11.5–15.5)
WBC: 7.3 10*3/uL (ref 4.0–10.5)
nRBC: 0 % (ref 0.0–0.2)

## 2023-02-28 LAB — TROPONIN I (HIGH SENSITIVITY)
Troponin I (High Sensitivity): 3 ng/L (ref <18)
Troponin I (High Sensitivity): 3 ng/L (ref <18)

## 2023-02-28 LAB — BASIC METABOLIC PANEL
Anion gap: 7 (ref 5–15)
BUN: 25 mg/dL — ABNORMAL HIGH (ref 8–23)
CO2: 28 mmol/L (ref 22–32)
Calcium: 9.2 mg/dL (ref 8.9–10.3)
Chloride: 106 mmol/L (ref 98–111)
Creatinine, Ser: 0.94 mg/dL (ref 0.61–1.24)
GFR, Estimated: >60 mL/min (ref >60)
Glucose, Bld: 92 mg/dL (ref 70–99)
Potassium: 4 mmol/L (ref 3.5–5.1)
Sodium: 141 mmol/L (ref 135–145)

## 2023-02-28 MED ORDER — SODIUM CHLORIDE 0.9% FLUSH: 3.0000 mL | Freq: Once | INTRAVENOUS | Status: DC

## 2023-02-28 NOTE — ED Triage Notes (Signed)
Pt is here due to left sided chest pain which began around 10am.  No sob, nausea or diaphoresis with this.

## 2023-02-28 NOTE — Discharge Instructions (Addendum)
Your workup including labs, chest x-ray, EKG were reassuring today.  Please take tylenol/ibuprofen for pain. I recommend close follow-up with PCP and cardiology for reevaluation.  Please do not hesitate to return to emergency department if worrisome signs symptoms we discussed become apparent.

## 2023-02-28 NOTE — ED Provider Notes (Signed)
[place] EMERGENCY DEPARTMENT AT [facility] Provider Note   CSN: [id number] Arrival date & time: 02/28/23  1635     History  Chief Complaint  Patient presents with   Chest Pain    Ralph Hayes is a 67 y.o. male with a past medical history of hyperlipidemia presents today for evaluation of chest pain.  Patient reports that he started to have chest pain around 10 AM today when he was sitting on the couch.  The pain is on the left side of the chest, tightness, nondraining.  Denies any neck pain, arm pain, back pain.  States each episode lasted about 10 to 20 minutes.  Denies any headache, dizziness, lightheadedness.  Patient does not have any pain right now.  Chest Pain     Past Medical History:  Diagnosis Date   Hyperlipidemia    Medical history non-contributory    Umbilical hernia    Past Surgical History:  Procedure Laterality Date   COLONOSCOPY     EYE SURGERY N/A    [employer] 03/29/2020   HERNIA REPAIR N/A    [employer] 03/29/2020   NO PAST SURGERIES     UMBILICAL HERNIA REPAIR N/A 12/28/2016   Procedure: LAPAROSCOPIC UMBILICAL HERNIA;  Surgeon: Ovidio Kin, MD;  Location: [facility] OR;  Service: General;  Laterality: N/A;   UMBILICAL HERNIA REPAIR N/A 12/18/2018   Procedure: LAPAROSCOPIC EXPLORATION  AND REPAIR OF UMBILICAL HERNIA ERAS PATHWAY;  Surgeon: Ovidio Kin, MD;  Location: [facility] ORS;  Service: General;  Laterality: N/A;     Home Medications Prior to Admission medications   Medication Sig Start Date End Date Taking? Authorizing Provider  atorvastatin (LIPITOR) 40 MG tablet Take 1 tablet (40 mg total) by mouth daily. 11/21/22   Shade Flood, MD  co-enzyme Q-10 50 MG capsule Take 300 mg by mouth daily.    [provider]  MULTIPLE VITAMINS ESSENTIAL PO Multiple Vitamins Patient not taking: Reported on 01/11/2023    [provider]      Allergies    Penicillins    Review of Systems   Review of Systems  Cardiovascular:  Positive for  chest pain.    Physical Exam Updated Vital Signs BP (!) 152/84   Pulse (!) 50   Temp 98 F (36.7 C) (Oral)   Resp 15   SpO2 98%  Physical Exam Vitals and nursing note reviewed.  Constitutional:      Appearance: Normal appearance.  HENT:     Head: Normocephalic and atraumatic.     Mouth/Throat:     Mouth: Mucous membranes are moist.  Eyes:     General: No scleral icterus. Cardiovascular:     Rate and Rhythm: Normal rate and regular rhythm.     Pulses: Normal pulses.     Heart sounds: Normal heart sounds.  Pulmonary:     Effort: Pulmonary effort is normal.     Breath sounds: Normal breath sounds.  Abdominal:     General: Abdomen is flat.     Palpations: Abdomen is soft.     Tenderness: There is no abdominal tenderness.  Musculoskeletal:        General: No deformity.  Skin:    General: Skin is warm.     Findings: No rash.  Neurological:     General: No focal deficit present.     Mental Status: He is alert.  Psychiatric:        Mood and Affect: Mood normal.     ED Results /  Procedures / Treatments   Labs (all labs ordered are listed, but only abnormal results are displayed) Labs Reviewed  BASIC METABOLIC PANEL - Abnormal; Notable for the following components:      Result Value   BUN 25 (*)    All other components within normal limits  CBC  TROPONIN I (HIGH SENSITIVITY)  TROPONIN I (HIGH SENSITIVITY)    EKG EKG Interpretation  Date/Time:  Thursday Feb 28 2023 16:42:01 EDT Ventricular Rate:  57 PR Interval:  150 QRS Duration: 96 QT Interval:  440 QTC Calculation: 428 R Axis:   -46 Text Interpretation: Sinus bradycardia Left anterior fascicular block Minimal voltage criteria for LVH, may be normal variant ( R in aVL ) Abnormal ECG When compared with ECG of 24-Dec-2016 15:30, No significant change was found when compared to prior, overall similar appearance. No STEMI Confirmed by Theda Belfast ([id number]) on 02/28/2023 4:58:36 PM  Radiology DG Chest 2  View  Result Date: 02/28/2023 CLINICAL DATA:  LEFT side chest pain since 1000 hours EXAM: CHEST - 2 VIEW COMPARISON:  09/02/2018 FINDINGS: Borderline enlargement of cardiac silhouette. Mediastinal contours and pulmonary vascularity normal. Lungs clear. No pulmonary infiltrate, pleural effusion, or pneumothorax. Osseous structures unremarkable. IMPRESSION: No acute abnormalities. Electronically Signed   By: Ulyses Southward M.D.   On: 02/28/2023 17:16    Procedures Procedures    Medications Ordered in ED Medications  sodium chloride flush (NS) 0.9 % injection 3 mL (3 mLs Intravenous Not Given 02/28/23 1714)    ED Course/ Medical Decision Making/ A&P                             Medical Decision Making Amount and/or Complexity of Data Reviewed Labs: ordered. Radiology: ordered.   This patient presents to the ED for chest pain, this involves an extensive number of treatment options, and is a complaint that carries with a high risk of complications and morbidity.  The differential diagnosis includes ACS, pericarditis, PE, pneumothorax, pneumonia, less likely dissection with essentially normal blood pressure, symmetric bilateral pulses, and no back pain.  This is not an exhaustive list.  Lab tests: I ordered and personally interpreted labs.  The pertinent results include: WBC unremarkable. Hbg unremarkable. Platelets unremarkable. Electrolytes unremarkable. BUN, creatinine unremarkable.  Delta troponin negative.  Imaging studies: I ordered imaging studies. I personally reviewed, interpreted imaging and agree with the radiologist's interpretations. The results include: Chest x-ray unremarkable.  Problem list/ ED course/ Critical interventions/ Medical management: HPI: See above Vital signs within normal range and stable throughout visit. Laboratory/imaging studies significant for: See above. On physical examination, patient is afebrile and appears in no acute distress. Exam without evidence of  volume overload so doubt heart failure. EKG without signs of active ischemia. Given the timing of pain to ER presentation, delta troponin was negative so doubt NSTEMI. Presentation not consistent with acute PE (Wells low risk), pneumothorax (not visualized on chest xr), thoracic aortic dissection, pericarditis, tamponade, pneumonia (no infectious symptoms, clear chest xr), myocarditis (no recent illness, neg trop).  7:50 PM Patient denies any pain at this point.  HEART score:3 so plan to discharge patient home with PCP and cardiology follow-up.  I have reviewed the patient home medicines and have made adjustments as needed.  Cardiac monitoring/EKG: The patient was maintained on a cardiac monitor.  I personally reviewed and interpreted the cardiac monitor which showed an underlying rhythm of: sinus rhythm.  Additional history obtained: External records  from outside source obtained and reviewed including: Chart review including previous notes, labs, imaging.  Consultations obtained:  Disposition Continued outpatient therapy. Follow-up with PCP and cardiology recommended for reevaluation of symptoms. Treatment plan discussed with patient.  Pt acknowledged understanding was agreeable to the plan. Worrisome signs and symptoms were discussed with patient, and patient acknowledged understanding to return to the ED if they noticed these signs and symptoms. Patient was stable upon discharge.   This chart was dictated using voice recognition software.  Despite best efforts to proofread,  errors can occur which can change the documentation meaning.          Final Clinical Impression(s) / ED Diagnoses Final diagnoses:  Chest pain, unspecified type    Rx / DC Orders ED Discharge Orders          Ordered    Ambulatory referral to Cardiology       Comments: If you have not heard from the Cardiology office within the next 72 hours please call [phone].   02/28/23 1943               Jeanelle Malling, [place] 02/28/23 1950    Tegeler, Canary Brim, MD 02/28/23 2259

## 2023-03-01 ENCOUNTER — Telehealth: Payer: Self-pay

## 2023-03-01 ENCOUNTER — Telehealth: Payer: Self-pay | Admitting: Family Medicine

## 2023-03-01 NOTE — Telephone Encounter (Signed)
Incoming message from access nurse Wife called in stating she took her husband to the ER for chest pain, they are requesting he follow up with Dr Neva Seat as well as cardiology asap. There is a concern about his bp as well as family history of brain aneurisms  pt has been scheduled

## 2023-03-01 NOTE — Telephone Encounter (Signed)
   Pt was called this morning. Pt has been scheduled for appt today at 10:20 am.

## 2023-03-01 NOTE — Telephone Encounter (Signed)
Actual appt is Monday Mar 04, 2023 at 10:20 am.

## 2023-03-04 ENCOUNTER — Ambulatory Visit: Payer: Federal, State, Local not specified - PPO | Admitting: Family Medicine

## 2023-03-04 VITALS — BP 126/78 | HR 57 | Temp 98.2°F | Ht 72.0 in | Wt 238.0 lb

## 2023-03-04 DIAGNOSIS — R030 Elevated blood-pressure reading, without diagnosis of hypertension: Secondary | ICD-10-CM

## 2023-03-04 DIAGNOSIS — R17 Unspecified jaundice: Secondary | ICD-10-CM

## 2023-03-04 DIAGNOSIS — R079 Chest pain, unspecified: Secondary | ICD-10-CM | POA: Diagnosis not present

## 2023-03-04 LAB — COMPREHENSIVE METABOLIC PANEL
ALT: 39 U/L (ref 0–53)
AST: 23 U/L (ref 0–37)
Albumin: 4.3 g/dL (ref 3.5–5.2)
Alkaline Phosphatase: 78 U/L (ref 39–117)
BUN: 19 mg/dL (ref 6–23)
CO2: 30 mEq/L (ref 19–32)
Calcium: 9.4 mg/dL (ref 8.4–10.5)
Chloride: 104 mEq/L (ref 96–112)
Creatinine, Ser: 0.92 mg/dL (ref 0.40–1.50)
GFR: 86.94 mL/min (ref >60.00)
Glucose, Bld: 88 mg/dL (ref 70–99)
Potassium: 4.3 mEq/L (ref 3.5–5.1)
Sodium: 142 mEq/L (ref 135–145)
Total Bilirubin: 1.6 mg/dL — ABNORMAL HIGH (ref 0.2–1.2)
Total Protein: 6.8 g/dL (ref 6.0–8.3)

## 2023-03-04 MED ORDER — AMLODIPINE BESYLATE 2.5 MG PO TABS
2.5000 mg | ORAL_TABLET | Freq: Every day | ORAL | 1 refills | Status: DC
Start: 2023-03-04 — End: 2023-03-27

## 2023-03-04 NOTE — Progress Notes (Signed)
Subjective:  Patient ID: Ralph Hayes, male    DOB: Aug 15, 1956  Age: 67 y.o. MRN: [id number]  CC:  Chief Complaint  Patient presents with   Hospitalization Follow-up    States bp has been high     HPI Ralph Hayes presents for   Follow-up from emergency department visit 4 days ago, note reviewed  Chest pain Chest pain noted while he was sitting on the couch on May 2.  Left-sided, tightness, no radiation.  Episodes lasted 10 to 20 minutes.  Asymptomatic in ER.  EKG with sinus bradycardia, left anterior fascicular block.  No appreciable change from previous.  Chest x-ray with no acute abnormalities.Troponin normal x 2.  BMP overall reassuring.  CBC normal.  Heart score of 3, plan for PCP and cardiology follow-up.  Here with spouse.   He does report that home blood pressures have been elevated. Noted chest pain while working on computer, stressful day. BP 140-150, then 165/116 as peak BP. No current antihypertensives.  He is on Lipitor 40 mg daily for hyperlipidemia. BP normal this weekend - working outside. 124-135 this weekend.  Elevated again 153/101 this morning. 124/70's last night.  No recent diet changes or more salt in diet. No associated HA or palpitations with elevated BP.  No current chest pain or headache. No chest pain since leaving ER.  Appt with cardiology on 5/15, Dr. Rosemary Holms.   No scleral icterus, or jaundice. Prior elevated bilirubin 1.7 in January.   No preceding alcohol intake.  Home wrist meter running about 20 pts higher than in office reading.    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   Bradycardia 05/04/2014   Past Medical History:  Diagnosis Date   Hyperlipidemia    Medical history non-contributory    Umbilical hernia    Past Surgical History:  Procedure Laterality Date   COLONOSCOPY     EYE SURGERY N/A    [employer] 03/29/2020   HERNIA REPAIR N/A    [employer] 03/29/2020   NO PAST SURGERIES     UMBILICAL HERNIA REPAIR N/A 12/28/2016   Procedure: LAPAROSCOPIC UMBILICAL HERNIA;  Surgeon: Ovidio Kin, MD;  Location: [facility] OR;  Service: General;  Laterality: N/A;   UMBILICAL HERNIA REPAIR N/A 12/18/2018   Procedure: LAPAROSCOPIC EXPLORATION  AND REPAIR OF UMBILICAL HERNIA ERAS PATHWAY;  Surgeon: Ovidio Kin, MD;  Location: [facility] OR[facility];  Service: General;  Laterality: N/A;   Allergies  Allergen Reactions   Penicillins Swelling    SWELLING REACTION UNSPECIFIED   Has patient had a PCN reaction causing immediate rash, facial/tongue/throat swelling, SOB or lightheadedness with hypotension:unsure Has patient had a PCN reaction causing severe rash involving mucus membranes or skin necrosis:unsure Has patient had a PCN reaction that required hospitalization:No Has patient had a PCN reaction occurring within the last 10 years:unsure If all of the above answers are "NO", then may proceed  with Cephalosporin use. Childhood reaction,   Prior to Admission medications   Medication Sig Start Date End Date Taking? Authorizing Provider  atorvastatin (LIPITOR) 40 MG tablet Take 1 tablet (40 mg total) by mouth daily. 11/21/22  Yes Shade Flood, MD  co-enzyme Q-10 50 MG capsule Take 300 mg by mouth daily.   Yes [provider]  MULTIPLE VITAMINS ESSENTIAL PO    Yes [provider]   Social History   Socioeconomic History   Marital status: Married     Spouse name: Not on file   Number of children: 2   Years of education: Not on file   Highest education level: Bachelor's degree (e.g., BA, AB, BS)  Occupational History   Not on file  Tobacco Use   Smoking status: Never   Smokeless tobacco: Never  Vaping Use   Vaping Use: Never used  Substance and Sexual Activity   Alcohol use: Yes    Alcohol/week: 2.0 standard drinks of alcohol    Types: 2 Glasses of wine per week    Comment: 2-3 glasses 1-2 times a week   Drug use: No   Sexual activity: Yes  Other Topics Concern   Not on file  Social History Narrative   Married. Education: [employer].    Social Determinants of Health   Financial Resource Strain: Patient Declined (03/04/2023)   Overall Financial Resource Strain (CARDIA)    Difficulty of Paying Living Expenses: Patient declined  Food Insecurity: Patient Declined (03/04/2023)   Hunger Vital Sign    Worried About Running Out of Food in the Last Year: Patient declined    Ran Out of Food in the Last Year: Patient declined  Transportation Needs: No Transportation Needs (03/04/2023)   PRAPARE - Administrator, Civil Service (Medical): No    Lack of Transportation (Non-Medical): No  Physical Activity: Insufficiently Active (03/04/2023)   Exercise Vital Sign    Days of Exercise per Week: 1 day    Minutes of Exercise per Session: 60 min  Stress: Stress Concern Present (03/04/2023)   [employer] of Occupational Health - Occupational Stress Questionnaire    Feeling of Stress : To some extent  Social Connections: Unknown (03/04/2023)   Social Connection and Isolation Panel [NHANES]    Frequency of Communication with Friends and Family: Three times a week    Frequency of Social Gatherings with Friends and Family: Once a week    Attends Religious Services: Never    Database administrator or Organizations: Patient declined    Attends Engineer, structural: Not on file    Marital Status: Patient declined  Intimate Partner  Violence: Not on file    Review of Systems   Objective:   Vitals:   03/04/23 1019  BP: 126/78  Pulse: (!) 57  Temp: 98.2 F (36.8 C)  TempSrc: Temporal  SpO2: 96%  Weight: 238 lb (108 kg)  Height: 6' (1.829 m)     Physical Exam Vitals reviewed.  Constitutional:      General: He is not in acute distress.    Appearance: He is well-developed. He is not ill-appearing, toxic-appearing or diaphoretic.  HENT:     Head: Normocephalic and atraumatic.  Eyes:     General: No scleral icterus. Neck:     Vascular: No carotid bruit or JVD.  Cardiovascular:     Rate and Rhythm: Normal rate and regular rhythm.     Heart sounds: Normal heart sounds. No murmur heard. Pulmonary:  Effort: Pulmonary effort is normal.     Breath sounds: Normal breath sounds. No rales.  Musculoskeletal:     Right lower leg: No edema.     Left lower leg: No edema.  Skin:    General: Skin is warm and dry.  Neurological:     Mental Status: He is alert and oriented to person, place, and time.  Psychiatric:        Mood and Affect: Mood normal.        Assessment & Plan:  Ralph Hayes is a 67 y.o. male . Chest pain, unspecified type  -Reassuring ER evaluation, asymptomatic since that time.  Has appointment follow-up with cardiology to decide on further testing.  ER precautions given.  Elevated blood pressure reading - Plan: amLODipine (NORVASC) 2.5 MG tablet  -Multiple elevated readings at home, in office his wrist meter meter was slightly higher than in office testing.  However did have elevated readings in the ER.  Home monitoring with preferably upper arm meter, option of low-dose amlodipine if persistent elevations but handout given on how to check blood pressure and management of hypertension.  Elevated bilirubin - Plan: Comprehensive metabolic panel, Reticulocytes, Pathologist smear review  -Repeat labs, recent CBC normal.  Check retake count, pathologist smear review, possible Gilbert's  syndrome with other LFTs normal.  Meds ordered this encounter  Medications   amLODipine (NORVASC) 2.5 MG tablet    Sig: Take 1 tablet (2.5 mg total) by mouth daily.    Dispense:  30 tablet    Refill:  1   Patient Instructions  Thanks for coming in today.  I am glad to hear that the chest pain has resolved since ER visit.  Keep follow-up with cardiology as planned to decide if other testing needed.  Blood pressure looks okay here today, but I know it has been running high at home at times.  Upper arm meter may be more accurate. I did write for amlodipine blood pressure medication if you have multiple elevated blood pressure readings.  If the first reading is high, rest and recheck that level in approximately an hour.  See information below on managing high blood pressure and recommended levels as well as how to check blood pressure.   I am also rechecking the bilirubin test and other tests to see if there are concerns with that reading.  That has been stable, and other liver tests have looked okay.  Please let me know if there are questions and take care.  Managing Your Hypertension Hypertension, also called high blood pressure, is when the force of the blood pressing against the walls of the arteries is too strong. Arteries are blood vessels that carry blood from your heart throughout your body. Hypertension forces the heart to work harder to pump blood and may cause the arteries to become narrow or stiff. Understanding blood pressure readings A blood pressure reading includes a higher number over a lower number: The first, or top, number is called the systolic pressure. It is a measure of the pressure in your arteries as your heart beats. The second, or bottom number, is called the diastolic pressure. It is a measure of the pressure in your arteries as the heart relaxes. For most people, a normal blood pressure is below 120/80. Your personal target blood pressure may vary depending on your  medical conditions, your age, and other factors. Blood pressure is classified into four stages. Based on your blood pressure reading, your health care provider may use  the following stages to determine what type of treatment you need, if any. Systolic pressure and diastolic pressure are measured in a unit called millimeters of mercury (mmHg). Normal Systolic pressure: below 120. Diastolic pressure: below 80. Elevated Systolic pressure: 120-129. Diastolic pressure: below 80. Hypertension stage 1 Systolic pressure: 130-139. Diastolic pressure: 80-89. Hypertension stage 2 Systolic pressure: 140 or above. Diastolic pressure: 90 or above. How can this condition affect me? Managing your hypertension is very important. Over time, hypertension can damage the arteries and decrease blood flow to parts of the body, including the brain, heart, and kidneys. Having untreated or uncontrolled hypertension can lead to: A heart attack. A stroke. A weakened blood vessel (aneurysm). Heart failure. Kidney damage. Eye damage. Memory and concentration problems. Vascular dementia. What actions can I take to manage this condition? Hypertension can be managed by making lifestyle changes and possibly by taking medicines. Your health care provider will help you make a plan to bring your blood pressure within a normal range. You may be referred for counseling on a healthy diet and physical activity. Nutrition  Eat a diet that is high in fiber and potassium, and low in salt (sodium), added sugar, and fat. An example eating plan is called the DASH diet. DASH stands for Dietary Approaches to Stop Hypertension. To eat this way: Eat plenty of fresh fruits and vegetables. Try to fill one-half of your plate at each meal with fruits and vegetables. Eat whole grains, such as whole-wheat pasta, brown rice, or whole-grain bread. Fill about one-fourth of your plate with whole grains. Eat low-fat dairy products. Avoid fatty  cuts of meat, processed or cured meats, and poultry with skin. Fill about one-fourth of your plate with lean proteins such as fish, chicken without skin, beans, eggs, and tofu. Avoid pre-made and processed foods. These tend to be higher in sodium, added sugar, and fat. Reduce your daily sodium intake. Many people with hypertension should eat less than 1,500 mg of sodium a day. Lifestyle  Work with your health care provider to maintain a healthy body weight or to lose weight. Ask what an ideal weight is for you. Get at least 30 minutes of exercise that causes your heart to beat faster (aerobic exercise) most days of the week. Activities may include walking, swimming, or biking. Include exercise to strengthen your muscles (resistance exercise), such as weight lifting, as part of your weekly exercise routine. Try to do these types of exercises for 30 minutes at least 3 days a week. Do not use any products that contain nicotine or tobacco. These products include cigarettes, chewing tobacco, and vaping devices, such as e-cigarettes. If you need help quitting, ask your health care provider. Control any long-term (chronic) conditions you have, such as high cholesterol or diabetes. Identify your sources of stress and find ways to manage stress. This may include meditation, deep breathing, or making time for fun activities. Alcohol use Do not drink alcohol if: Your health care provider tells you not to drink. You are pregnant, may be pregnant, or are planning to become pregnant. If you drink alcohol: Limit how much you have to: 0-1 drink a day for women. 0-2 drinks a day for men. Know how much alcohol is in your drink. In the [place] one drink equals one 12 oz bottle of beer (355 mL), one 5 oz glass of wine (148 mL), or one 1 oz glass of hard liquor (44 mL). Medicines Your health care provider may prescribe medicine if lifestyle changes are  not enough to get your blood pressure under control and if: Your  systolic blood pressure is 130 or higher. Your diastolic blood pressure is 80 or higher. Take medicines only as told by your health care provider. Follow the directions carefully. Blood pressure medicines must be taken as told by your health care provider. The medicine does not work as well when you skip doses. Skipping doses also puts you at risk for problems. Monitoring Before you monitor your blood pressure: Do not smoke, drink caffeinated beverages, or exercise within 30 minutes before taking a measurement. Use the bathroom and empty your bladder (urinate). Sit quietly for at least 5 minutes before taking measurements. Monitor your blood pressure at home as told by your health care provider. To do this: Sit with your back straight and supported. Place your feet flat on the floor. Do not cross your legs. Support your arm on a flat surface, such as a table. Make sure your upper arm is at heart level. Each time you measure, take two or three readings one minute apart and record the results. You may also need to have your blood pressure checked regularly by your health care provider. General information Talk with your health care provider about your diet, exercise habits, and other lifestyle factors that may be contributing to hypertension. Review all the medicines you take with your health care provider because there may be side effects or interactions. Keep all follow-up visits. Your health care provider can help you create and adjust your plan for managing your high blood pressure. Where to find more information National Heart, Lung, and Blood Institute: PopSte[employer].is American Heart Association: www.heart.org Contact a health care provider if: You think you are having a reaction to medicines you have taken. You have repeated (recurrent) headaches. You feel dizzy. You have swelling in your ankles. You have trouble with your vision. Get help right away if: You develop a severe  headache or confusion. You have unusual weakness or numbness, or you feel faint. You have severe pain in your chest or abdomen. You vomit repeatedly. You have trouble breathing. These symptoms may be an emergency. Get help right away. Call 911. Do not wait to see if the symptoms will go away. Do not drive yourself to the hospital. Summary Hypertension is when the force of blood pumping through your arteries is too strong. If this condition is not controlled, it may put you at risk for serious complications. Your personal target blood pressure may vary depending on your medical conditions, your age, and other factors. For most people, a normal blood pressure is less than 120/80. Hypertension is managed by lifestyle changes, medicines, or both. Lifestyle changes to help manage hypertension include losing weight, eating a healthy, low-sodium diet, exercising more, stopping smoking, and limiting alcohol. This information is not intended to replace advice given to you by your health care provider. Make sure you discuss any questions you have with your health care provider. Document Revised: 06/29/2021 Document Reviewed: 06/29/2021 Elsevier Patient Education  2023 [employer] Inc.     Signed,   Meredith Staggers, MD [facility], [facility] 03/04/23 12:39 PM

## 2023-03-04 NOTE — Patient Instructions (Addendum)
Thanks for coming in today.  I am glad to hear that the chest pain has resolved since ER visit.  Keep follow-up with cardiology as planned to decide if other testing needed.  Blood pressure looks okay here today, but I know it has been running high at home at times.  Upper arm meter may be more accurate. I did write for amlodipine blood pressure medication if you have multiple elevated blood pressure readings.  If the first reading is high, rest and recheck that level in approximately an hour.  See information below on managing high blood pressure and recommended levels as well as how to check blood pressure.   I am also rechecking the bilirubin test and other tests to see if there are concerns with that reading.  That has been stable, and other liver tests have looked okay.  Please let me know if there are questions and take care.  Managing Your Hypertension Hypertension, also called high blood pressure, is when the force of the blood pressing against the walls of the arteries is too strong. Arteries are blood vessels that carry blood from your heart throughout your body. Hypertension forces the heart to work harder to pump blood and may cause the arteries to become narrow or stiff. Understanding blood pressure readings A blood pressure reading includes a higher number over a lower number: The first, or top, number is called the systolic pressure. It is a measure of the pressure in your arteries as your heart beats. The second, or bottom number, is called the diastolic pressure. It is a measure of the pressure in your arteries as the heart relaxes. For most people, a normal blood pressure is below 120/80. Your personal target blood pressure may vary depending on your medical conditions, your age, and other factors. Blood pressure is classified into four stages. Based on your blood pressure reading, your health care provider may use the following stages to determine what type of treatment you need, if  any. Systolic pressure and diastolic pressure are measured in a unit called millimeters of mercury (mmHg). Normal Systolic pressure: below 120. Diastolic pressure: below 80. Elevated Systolic pressure: 120-129. Diastolic pressure: below 80. Hypertension stage 1 Systolic pressure: 130-139. Diastolic pressure: 80-89. Hypertension stage 2 Systolic pressure: 140 or above. Diastolic pressure: 90 or above. How can this condition affect me? Managing your hypertension is very important. Over time, hypertension can damage the arteries and decrease blood flow to parts of the body, including the brain, heart, and kidneys. Having untreated or uncontrolled hypertension can lead to: A heart attack. A stroke. A weakened blood vessel (aneurysm). Heart failure. Kidney damage. Eye damage. Memory and concentration problems. Vascular dementia. What actions can I take to manage this condition? Hypertension can be managed by making lifestyle changes and possibly by taking medicines. Your health care provider will help you make a plan to bring your blood pressure within a normal range. You may be referred for counseling on a healthy diet and physical activity. Nutrition  Eat a diet that is high in fiber and potassium, and low in salt (sodium), added sugar, and fat. An example eating plan is called the DASH diet. DASH stands for Dietary Approaches to Stop Hypertension. To eat this way: Eat plenty of fresh fruits and vegetables. Try to fill one-half of your plate at each meal with fruits and vegetables. Eat whole grains, such as whole-wheat pasta, brown rice, or whole-grain bread. Fill about one-fourth of your plate with whole grains. Eat low-fat dairy  products. Avoid fatty cuts of meat, processed or cured meats, and poultry with skin. Fill about one-fourth of your plate with lean proteins such as fish, chicken without skin, beans, eggs, and tofu. Avoid pre-made and processed foods. These tend to be higher in  sodium, added sugar, and fat. Reduce your daily sodium intake. Many people with hypertension should eat less than 1,500 mg of sodium a day. Lifestyle  Work with your health care provider to maintain a healthy body weight or to lose weight. Ask what an ideal weight is for you. Get at least 30 minutes of exercise that causes your heart to beat faster (aerobic exercise) most days of the week. Activities may include walking, swimming, or biking. Include exercise to strengthen your muscles (resistance exercise), such as weight lifting, as part of your weekly exercise routine. Try to do these types of exercises for 30 minutes at least 3 days a week. Do not use any products that contain nicotine or tobacco. These products include cigarettes, chewing tobacco, and vaping devices, such as e-cigarettes. If you need help quitting, ask your health care provider. Control any long-term (chronic) conditions you have, such as high cholesterol or diabetes. Identify your sources of stress and find ways to manage stress. This may include meditation, deep breathing, or making time for fun activities. Alcohol use Do not drink alcohol if: Your health care provider tells you not to drink. You are pregnant, may be pregnant, or are planning to become pregnant. If you drink alcohol: Limit how much you have to: 0-1 drink a day for women. 0-2 drinks a day for men. Know how much alcohol is in your drink. In the [place] one drink equals one 12 oz bottle of beer (355 mL), one 5 oz glass of wine (148 mL), or one 1 oz glass of hard liquor (44 mL). Medicines Your health care provider may prescribe medicine if lifestyle changes are not enough to get your blood pressure under control and if: Your systolic blood pressure is 130 or higher. Your diastolic blood pressure is 80 or higher. Take medicines only as told by your health care provider. Follow the directions carefully. Blood pressure medicines must be taken as told by your  health care provider. The medicine does not work as well when you skip doses. Skipping doses also puts you at risk for problems. Monitoring Before you monitor your blood pressure: Do not smoke, drink caffeinated beverages, or exercise within 30 minutes before taking a measurement. Use the bathroom and empty your bladder (urinate). Sit quietly for at least 5 minutes before taking measurements. Monitor your blood pressure at home as told by your health care provider. To do this: Sit with your back straight and supported. Place your feet flat on the floor. Do not cross your legs. Support your arm on a flat surface, such as a table. Make sure your upper arm is at heart level. Each time you measure, take two or three readings one minute apart and record the results. You may also need to have your blood pressure checked regularly by your health care provider. General information Talk with your health care provider about your diet, exercise habits, and other lifestyle factors that may be contributing to hypertension. Review all the medicines you take with your health care provider because there may be side effects or interactions. Keep all follow-up visits. Your health care provider can help you create and adjust your plan for managing your high blood pressure. Where to find more information National  Heart, Lung, and Blood Institute: PopSte[employer].is American Heart Association: www.heart.org Contact a health care provider if: You think you are having a reaction to medicines you have taken. You have repeated (recurrent) headaches. You feel dizzy. You have swelling in your ankles. You have trouble with your vision. Get help right away if: You develop a severe headache or confusion. You have unusual weakness or numbness, or you feel faint. You have severe pain in your chest or abdomen. You vomit repeatedly. You have trouble breathing. These symptoms may be an emergency. Get help right away.  Call 911. Do not wait to see if the symptoms will go away. Do not drive yourself to the hospital. Summary Hypertension is when the force of blood pumping through your arteries is too strong. If this condition is not controlled, it may put you at risk for serious complications. Your personal target blood pressure may vary depending on your medical conditions, your age, and other factors. For most people, a normal blood pressure is less than 120/80. Hypertension is managed by lifestyle changes, medicines, or both. Lifestyle changes to help manage hypertension include losing weight, eating a healthy, low-sodium diet, exercising more, stopping smoking, and limiting alcohol. This information is not intended to replace advice given to you by your health care provider. Make sure you discuss any questions you have with your health care provider. Document Revised: 06/29/2021 Document Reviewed: 06/29/2021 Elsevier Patient Education  2023 [employer].

## 2023-03-05 LAB — PATHOLOGIST SMEAR REVIEW

## 2023-03-13 ENCOUNTER — Encounter: Payer: Self-pay | Admitting: Cardiology

## 2023-03-13 ENCOUNTER — Ambulatory Visit: Payer: Federal, State, Local not specified - PPO | Admitting: Cardiology

## 2023-03-13 VITALS — BP 134/78 | HR 56 | Resp 16 | Ht 72.0 in | Wt 239.0 lb

## 2023-03-13 DIAGNOSIS — R931 Abnormal findings on diagnostic imaging of heart and coronary circulation: Secondary | ICD-10-CM

## 2023-03-13 DIAGNOSIS — R030 Elevated blood-pressure reading, without diagnosis of hypertension: Secondary | ICD-10-CM

## 2023-03-13 DIAGNOSIS — E782 Mixed hyperlipidemia: Secondary | ICD-10-CM | POA: Insufficient documentation

## 2023-03-13 DIAGNOSIS — R072 Precordial pain: Secondary | ICD-10-CM

## 2023-03-13 NOTE — Patient Instructions (Signed)
Diet & Lifestyle recommendations:  Physical activity recommendation (The Physical Activity Guidelines for Americans. JAMA 2018;Nov 12) At least 150-300 minutes a week of moderate-intensity, or 75-150 minutes a week of vigorous-intensity aerobic physical activity, or an equivalent combination of moderate- and vigorous-intensity aerobic activity. Adults should perform muscle-strengthening activities on 2 or more days a week. Older adults should do multicomponent physical activity that includes balance training as well as aerobic and muscle-strengthening activities. Benefits of increased physical activity include lower risk of mortality including cardiovascular mortality, lower risk of cardiovascular events and associated risk factors (hypertension and diabetes), and lower risk of many cancers (including bladder, breast, colon, endometrium, esophagus, kidney, lung, and stomach). Additional improvments have been seen in cognition, risk of dementia, anxiety and depression, improved bone health, lower risk of falls, and associated injuries.  Dietary recommendation The 2019 ACC/AHA guidelines promote nutrition as a main fixture of cardiovascular wellness, with a recommendation for a varied diet of fruit, vegetables, fish, legumes, and whole grains (Class I), as well as recommendations to reduce sodium, cholesterol, processed meats, and refined sugars (Class IIa recommendation).10 Sodium intake, a topic of some controversy as of late, is recommended to be kept at 1,500 mg/day or less, far below the average daily intake in the [place] of 3,409 mg/day, and notably below that of previous US recommendations for <2,300mg/day.10,11 For those unable to reach 1,500 mg/day, they recommend at least a reduction of 1000 mg/day.  A Pesco-Mediterranean Diet With Intermittent Fasting: JACC Review Topic of the Week. J Am Coll Cardiol 2020;76:1484-1493 Pesco-Mediterranean diet, it is supplemented with extra-virgin olive oil (EVOO),  which is the principle fat source, along with moderate amounts of dairy (particularly yogurt and cheese) and eggs, as well as modest amounts of alcohol consumption (ideally red wine with the evening meal), but few red and processed meats.  

## 2023-03-13 NOTE — Progress Notes (Signed)
Patient referred by Shade Flood, MD for chest pain  Subjective:   Ralph Hayes, male    DOB: [birth year], 67 y.o.   MRN: [id number]   Chief Complaint  Patient presents with   Chest Pain   Follow-up     HPI  67 y.o. Caucasian male with chest pain  Patient had an ER visit earlier this month with intermittent chest pain associated with elevated blood pressure.  Patient has been under a lot of stress related to work.  ACS was excluded during ER visit.  He has not had any recurrent pain with walking and physical activity.  He was given amlodipine 2.5 mg daily, for as needed use of blood pressure is elevated.  Blood pressure is typically staying around 130s/70s at home.  Reviewed recent test results with the patient, details below.  Initial consultation visit 10/2022: Patient was last seen by Dr. Jacinto Halim in 2016.  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  Since then, he has been doing more yard work, currently building a Insurance account manager.  Few days ago, he had left-sided dull pain lasting for a few minutes, unrelated to exertion.  He was recommended by his PCP Dr. Chilton Si to get it evaluated by cardiology, since his last cardiac work-up with stress test and echocardiogram was back in 2016, details below.  Patient is currently on atorvastatin 40 mg daily.  He tried cutting it in half, which led to increase in his LDL.  Therefore, he is now back on 40 mg of atorvastatin.  Blood pressure slightly elevated today.  Usually, it is well controlled.    Current Outpatient Medications:    atorvastatin (LIPITOR) 40 MG tablet, Take 1 tablet (40 mg total) by mouth daily., Disp: 90 tablet, Rfl: 2   co-enzyme Q-10 50 MG capsule, Take 300 mg by mouth daily., Disp: , Rfl:    MULTIPLE VITAMINS ESSENTIAL PO, , Disp: , Rfl:    amLODipine (NORVASC) 2.5 MG tablet, Take 1 tablet (2.5 mg total) by mouth daily. 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  Patient reached 10.1 METS, and 96% of age predicted maximum heart rate.  Exercise capacity was excellent.  No chest pain reported.  Normal heart rate and hemodynamic response. Stress EKG revealed no ischemic changes. Low risk study.   Echocardiogram 12/17/2022:  Normal LV systolic function with visual EF 55-60%. Left ventricle cavity  is normal in size. Normal left ventricular wall thickness. Normal global  wall motion. Doppler evidence of grade I (impaired) diastolic dysfunction,  normal LAP. Calculated EF 71%.  Left atrial cavity is mildly dilated.  Structurally normal tricuspid valve with trace regurgitation. No evidence  of pulmonary hypertension.  Compared to 11/2021 EF has now normalized (previously 45-50%).   CT cardiac scoring 12/15/2021: Total Agatston score: 6 (LAD)  [place] database percentile: 27  Scattered aortic atherosclerosis.  No acute extra cardiac abnormality.    Recent labs: Jan-May 2024: Glucose 88, BUN/Cr 19/0.92. EGFR 86. Na/K 142/4.3. T. Bili 1.6. Rest of the CMP normal H/H 15/46. MCV 90. Platelets 221 HbA1C 5.6% Chol 196, TG 173, HDL 52, LDL 109 TSH NA  10/18/2021: Glucose 98, BUN/Cr 21/0.96. EGFR 83. Na/K 141/4.1. Rest of the CMP  normal H/H 14/45. MCV 87. Platelets 277 HbA1C 5.7% Chol 193, TG 163, HDL 55, LDL 105 TSH 2.7 normal (03/2021)   Review of Systems  Cardiovascular:  Positive for chest pain. Negative for dyspnea on exertion, leg swelling, palpitations and syncope.         Vitals:   03/13/23 0839 03/13/23 0840  BP: (!) 147/91 134/78  Pulse: (!) 59 (!) 56  Resp: 16   SpO2: 95%      Body mass index is 32.41 kg/m. There were no vitals filed for this visit.   Objective:   Physical Exam Vitals and nursing note  reviewed.  Constitutional:      General: He is not in acute distress. Neck:     Vascular: No JVD.  Cardiovascular:     Rate and Rhythm: Normal rate and regular rhythm.     Heart sounds: Normal heart sounds. No murmur heard. Pulmonary:     Effort: Pulmonary effort is normal.     Breath sounds: Normal breath sounds. No wheezing or rales.  Musculoskeletal:     Right lower leg: No edema.     Left lower leg: No edema.         Assessment & Recommendations:   67 year old Caucasian male with chest pain  Chest pain: Atypical.  Resolved.  Exercise treadmill stress test was unremarkable in 2023.  Minimally elevated calcium score in 2023, 6. Reasonable to hold off further ischemia testing at this time. Discussed diet and lifestyle modifications for prevention. Increase physical activity to reach 10,000 steps daily, practice low-salt, low saturated fat Mediterranean style heart healthy diet. Continue Lipitor 40 mg daily at this time.  Will repeat lipid panel in 3 months.  If LDL is not reduce, could increase Lipitor to 80 mg daily or add Zetia 10 mg daily.  Elevated blood pressure without diagnosis of hypertension: Stress is certainly contributing.  He has amlodipine 2.5 mg daily, which she is not taking regularly.  If SBP consistently >140 mmHg, encouraged him to take amlodipine regularly.  Will reassess at next visit.  F/u in 3 months   Elder Negus, MD Pager: [phone] Office: [phone]

## 2023-03-27 ENCOUNTER — Other Ambulatory Visit: Payer: Self-pay | Admitting: Family Medicine

## 2023-03-27 DIAGNOSIS — R030 Elevated blood-pressure reading, without diagnosis of hypertension: Secondary | ICD-10-CM

## 2023-04-08 ENCOUNTER — Ambulatory Visit: Payer: Federal, State, Local not specified - PPO | Admitting: Cardiovascular Disease

## 2023-05-06 ENCOUNTER — Other Ambulatory Visit: Payer: Self-pay

## 2023-05-06 ENCOUNTER — Other Ambulatory Visit (INDEPENDENT_AMBULATORY_CARE_PROVIDER_SITE_OTHER): Payer: Federal, State, Local not specified - PPO

## 2023-05-06 ENCOUNTER — Encounter: Payer: Federal, State, Local not specified - PPO | Admitting: Family Medicine

## 2023-05-06 DIAGNOSIS — E785 Hyperlipidemia, unspecified: Secondary | ICD-10-CM

## 2023-05-06 LAB — COMPREHENSIVE METABOLIC PANEL
ALT: 36 U/L (ref 0–53)
AST: 20 U/L (ref 0–37)
Albumin: 4.3 g/dL (ref 3.5–5.2)
Alkaline Phosphatase: 80 U/L (ref 39–117)
BUN: 17 mg/dL (ref 6–23)
CO2: 31 mEq/L (ref 19–32)
Calcium: 9.7 mg/dL (ref 8.4–10.5)
Chloride: 102 mEq/L (ref 96–112)
Creatinine, Ser: 1.04 mg/dL (ref 0.40–1.50)
GFR: 74.96 mL/min (ref >60.00)
Glucose, Bld: 110 mg/dL — ABNORMAL HIGH (ref 70–99)
Potassium: 4.4 mEq/L (ref 3.5–5.1)
Sodium: 142 mEq/L (ref 135–145)
Total Bilirubin: 1.4 mg/dL — ABNORMAL HIGH (ref 0.2–1.2)
Total Protein: 6.8 g/dL (ref 6.0–8.3)

## 2023-05-06 LAB — LIPID PANEL
Cholesterol: 214 mg/dL — ABNORMAL HIGH (ref 0–200)
HDL: 49.8 mg/dL (ref >39.00)
NonHDL: 163.89
Total CHOL/HDL Ratio: 4
Triglycerides: 228 mg/dL — ABNORMAL HIGH (ref 0.0–149.0)
VLDL: 45.6 mg/dL — ABNORMAL HIGH (ref 0.0–40.0)

## 2023-05-06 LAB — LDL CHOLESTEROL, DIRECT: Direct LDL: 128 mg/dL

## 2023-05-20 ENCOUNTER — Ambulatory Visit: Payer: Federal, State, Local not specified - PPO | Admitting: Family Medicine

## 2023-05-21 DIAGNOSIS — N400 Enlarged prostate without lower urinary tract symptoms: Secondary | ICD-10-CM | POA: Diagnosis not present

## 2023-05-31 DIAGNOSIS — N400 Enlarged prostate without lower urinary tract symptoms: Secondary | ICD-10-CM | POA: Diagnosis not present

## 2023-05-31 DIAGNOSIS — R972 Elevated prostate specific antigen [PSA]: Secondary | ICD-10-CM | POA: Diagnosis not present

## 2023-05-31 NOTE — Progress Notes (Signed)
Cancelled - erroneous encounter.  This encounter was created in error - please disregard.

## 2023-06-03 ENCOUNTER — Other Ambulatory Visit: Payer: Self-pay | Admitting: Urology

## 2023-06-03 DIAGNOSIS — R972 Elevated prostate specific antigen [PSA]: Secondary | ICD-10-CM

## 2023-06-05 ENCOUNTER — Telehealth: Payer: Self-pay | Admitting: Family Medicine

## 2023-06-05 NOTE — Telephone Encounter (Signed)
Okay to cancel this appointment and follow-up in 6 months.  I have called patient and advised him of this plan, but please  cancel his appointment for tomorrow in [facility].  I did receive this message today but unable to send to staff prior to them leaving.  Should not be charged for no-show since he did call day prior. Thanks.

## 2023-06-05 NOTE — Telephone Encounter (Signed)
Sent to Dr.Greene Please advise.

## 2023-06-05 NOTE — Telephone Encounter (Signed)
Caller name: KEVAL WOLSKE  On DPR?: Yes  Call back number: [phone] (mobile)  Provider they see: Shade Flood, MD  Reason for call:  Pt was scheduled for appt and rescheduled due to provider being out of office due to sickness. He went ahead and had his labs done since he was at the office. He wants to know if it is necessary to keep his 8/8 appt since it was a f/u med check & labs

## 2023-06-06 ENCOUNTER — Ambulatory Visit: Payer: Federal, State, Local not specified - PPO | Admitting: Family Medicine

## 2023-06-06 NOTE — Telephone Encounter (Signed)
Cancellation completed

## 2023-07-05 ENCOUNTER — Ambulatory Visit: Payer: Federal, State, Local not specified - PPO | Admitting: Cardiology

## 2023-07-09 ENCOUNTER — Ambulatory Visit: Payer: Federal, State, Local not specified - PPO | Admitting: Cardiology

## 2023-07-09 ENCOUNTER — Encounter: Payer: Self-pay | Admitting: Cardiology

## 2023-07-09 VITALS — BP 155/89 | HR 59 | Resp 16 | Ht 72.0 in | Wt 238.4 lb

## 2023-07-09 DIAGNOSIS — R030 Elevated blood-pressure reading, without diagnosis of hypertension: Secondary | ICD-10-CM

## 2023-07-09 DIAGNOSIS — E782 Mixed hyperlipidemia: Secondary | ICD-10-CM | POA: Diagnosis not present

## 2023-07-09 MED ORDER — ATORVASTATIN CALCIUM 80 MG PO TABS: 80.0000 mg | ORAL_TABLET | Freq: Every day | ORAL | 2 refills | Status: DC

## 2023-07-09 NOTE — Progress Notes (Signed)
Patient referred by Shade Flood, MD for chest pain  Subjective:   Ralph Hayes, male    DOB: [birth year], 67 y.o.   MRN: [id number]   Chief Complaint  Patient presents with   Mixed hyperlipidemia   Follow-up     HPI  67 y.o. Caucasian male with chest pain  He has had no recurrent chest pain. Blood pressure elevated today, but lower at home, according to him. He is unwilling to been blood pressure medications at this time.   Initial consultation visit 10/2022: Patient was last seen by Dr. Jacinto Halim in 2016.  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  Since then, he has been doing more yard work, currently building a Insurance account manager.  Few days ago, he had left-sided dull pain lasting for a few minutes, unrelated to exertion.  He was recommended by his PCP Dr. Chilton Si to get it evaluated by cardiology, since his last cardiac work-up with stress test and echocardiogram was back in 2016, details below.  Patient is currently on atorvastatin 40 mg daily.  He tried cutting it in half, which led to increase in his LDL.  Therefore, he is now back on 40 mg of atorvastatin.  Blood pressure slightly elevated today.  Usually, it is well controlled.    Current Outpatient Medications:    atorvastatin (LIPITOR) 40 MG tablet, Take 1 tablet (40 mg total) by mouth daily., Disp: 90 tablet, Rfl: 2   co-enzyme Q-10 50 MG capsule, Take 300 mg by mouth daily., Disp: , Rfl:    MULTIPLE VITAMINS ESSENTIAL PO, , Disp: , Rfl:    amLODipine (NORVASC) 2.5 MG tablet, TAKE 1 TABLET BY MOUTH EVERY DAY (Patient not taking: Reported on 07/09/2023), Disp: 90 tablet, Rfl: 1  Cardiovascular and other pertinent studies:  EKG 02/28/2023: Sinus rhythm 57 bpm Borderline LVH LAFB  Exercise treadmill stress test 12/07/2021: Exercise treadmill stress test performed using Bruce protocol.  Patient reached 10.1 METS, and 96% of age predicted maximum heart rate.  Exercise capacity  was excellent.  No chest pain reported.  Normal heart rate and hemodynamic response. Stress EKG revealed no ischemic changes. Low risk study.   Echocardiogram 12/17/2022:  Normal LV systolic function with visual EF 55-60%. Left ventricle cavity  is normal in size. Normal left ventricular wall thickness. Normal global  wall motion. Doppler evidence of grade I (impaired) diastolic dysfunction,  normal LAP. Calculated EF 71%.  Left atrial cavity is mildly dilated.  Structurally normal tricuspid valve with trace regurgitation. No evidence  of pulmonary hypertension.  Compared to 11/2021 EF has now normalized (previously 45-50%).   CT cardiac scoring 12/15/2021: Total Agatston score: 6 (LAD)  [place] database percentile: 27  Scattered aortic atherosclerosis.  No acute extra cardiac abnormality.    Recent labs: 05/06/2023: Glucose 110, BUN/Cr 17/1.04. EGFR 74. Na/K 142/4.4. T. Bili 1.4. Rest of the CMP normal Chol 214, TG 228, HDL 49, LDL 128  Jan-May 2024: Glucose 88, BUN/Cr 19/0.92. EGFR 86. Na/K 142/4.3. T. Bili 1.6. Rest of the CMP normal H/H 15/46. MCV 90. Platelets 221 HbA1C 5.6% Chol 196, TG 173, HDL 52, LDL 109 TSH NA  10/18/2021: Glucose 98, BUN/Cr 21/0.96. EGFR 83. Na/K 141/4.1. Rest of the CMP normal H/H 14/45. MCV 87. Platelets 277 HbA1C 5.7% Chol 193, TG 163, HDL 55, LDL 105 TSH 2.7 normal (03/2021)   Review of Systems  Cardiovascular:  Negative for chest  pain, dyspnea on exertion, leg swelling, palpitations and syncope.         Vitals:   07/09/23 1528 07/09/23 1531  BP: (!) 167/94 (!) 155/89  Pulse: 73 (!) 59  Resp: 16       Body mass index is 32.33 kg/m. Filed Weights   07/09/23 1528  Weight: 238 lb 6.4 oz (108.1 kg)     Objective:   Physical Exam Vitals and nursing note reviewed.  Constitutional:      General: He is not in acute distress. Neck:     Vascular: No JVD.  Cardiovascular:     Rate and Rhythm: Normal rate and regular rhythm.      Heart sounds: Normal heart sounds. No murmur heard. Pulmonary:     Effort: Pulmonary effort is normal.     Breath sounds: Normal breath sounds. No wheezing or rales.  Musculoskeletal:     Right lower leg: No edema.     Left lower leg: No edema.         Assessment & Recommendations:   67 year old Caucasian male with hypertension, hyperlipidemia.chest pain  Chest pain: Resolved at this time. Exercise treadmill stress test was unremarkable in 2023.  Minimally elevated calcium score in 2023, 6. Increase Lipitor to 80 mg daily or add Zetia 10 mg daily. Repeat lipid panel with PCP.  Elevated blood pressure without diagnosis of hypertension: He reports lower blood pressure at home. Possible white coat hypertension. He is not keen on blood pressure medications at this time. Recommend regular monitoring and f/u with PCP. If SBP remains >140 mmhHg, could add amlodipine 5 mg daily.  F/u in 3 months   Elder Negus, MD Pager: [phone] Office: [phone]

## 2023-07-15 ENCOUNTER — Ambulatory Visit: Payer: Federal, State, Local not specified - PPO | Admitting: Cardiology

## 2023-07-17 ENCOUNTER — Ambulatory Visit
Admission: RE | Admit: 2023-07-17 | Discharge: 2023-07-17 | Disposition: A | Discharge status: Home / Self Care | Payer: Federal, State, Local not specified - PPO | Source: Ambulatory Visit | Attending: Urology | Admitting: Urology

## 2023-07-17 DIAGNOSIS — R972 Elevated prostate specific antigen [PSA]: Secondary | ICD-10-CM

## 2023-07-17 MED ORDER — GADOPICLENOL 0.5 MMOL/ML IV SOLN
10.0000 mL | Freq: Once | INTRAVENOUS | Status: AC | PRN
  Administered 2023-07-17: 10 mL via INTRAVENOUS

## 2023-08-15 DIAGNOSIS — L578 Other skin changes due to chronic exposure to nonionizing radiation: Secondary | ICD-10-CM | POA: Diagnosis not present

## 2023-08-15 DIAGNOSIS — L821 Other seborrheic keratosis: Secondary | ICD-10-CM | POA: Diagnosis not present

## 2023-08-15 DIAGNOSIS — D225 Melanocytic nevi of trunk: Secondary | ICD-10-CM | POA: Diagnosis not present

## 2023-08-15 DIAGNOSIS — L570 Actinic keratosis: Secondary | ICD-10-CM | POA: Diagnosis not present

## 2023-09-03 DIAGNOSIS — N400 Enlarged prostate without lower urinary tract symptoms: Secondary | ICD-10-CM | POA: Diagnosis not present

## 2023-09-12 DIAGNOSIS — R972 Elevated prostate specific antigen [PSA]: Secondary | ICD-10-CM | POA: Diagnosis not present

## 2023-09-12 DIAGNOSIS — N400 Enlarged prostate without lower urinary tract symptoms: Secondary | ICD-10-CM | POA: Diagnosis not present

## 2023-11-22 ENCOUNTER — Ambulatory Visit (INDEPENDENT_AMBULATORY_CARE_PROVIDER_SITE_OTHER): Payer: Federal, State, Local not specified - PPO | Admitting: Family Medicine

## 2023-11-22 ENCOUNTER — Encounter: Payer: Self-pay | Admitting: Family Medicine

## 2023-11-22 VITALS — BP 134/78 | HR 60 | Temp 98.0°F | Ht 72.0 in | Wt 238.8 lb

## 2023-11-22 DIAGNOSIS — Z125 Encounter for screening for malignant neoplasm of prostate: Secondary | ICD-10-CM

## 2023-11-22 DIAGNOSIS — Z131 Encounter for screening for diabetes mellitus: Secondary | ICD-10-CM

## 2023-11-22 DIAGNOSIS — R0789 Other chest pain: Secondary | ICD-10-CM

## 2023-11-22 DIAGNOSIS — R0781 Pleurodynia: Secondary | ICD-10-CM | POA: Diagnosis not present

## 2023-11-22 DIAGNOSIS — R17 Unspecified jaundice: Secondary | ICD-10-CM | POA: Diagnosis not present

## 2023-11-22 DIAGNOSIS — E785 Hyperlipidemia, unspecified: Secondary | ICD-10-CM | POA: Diagnosis not present

## 2023-11-22 DIAGNOSIS — Z0001 Encounter for general adult medical examination with abnormal findings: Secondary | ICD-10-CM | POA: Diagnosis not present

## 2023-11-22 DIAGNOSIS — Z0000 Encounter for general adult medical examination without abnormal findings: Secondary | ICD-10-CM

## 2023-11-22 LAB — LIPID PANEL
Cholesterol: 202 mg/dL — ABNORMAL HIGH (ref 0–200)
HDL: 52.9 mg/dL (ref >39.00)
LDL Cholesterol: 111 mg/dL — ABNORMAL HIGH (ref 0–99)
NonHDL: 149.32
Total CHOL/HDL Ratio: 4
Triglycerides: 191 mg/dL — ABNORMAL HIGH (ref 0.0–149.0)
VLDL: 38.2 mg/dL (ref 0.0–40.0)

## 2023-11-22 LAB — CBC WITH DIFFERENTIAL/PLATELET
Basophils Absolute: 0.1 10*3/uL (ref 0.0–0.1)
Basophils Relative: 1.3 % (ref 0.0–3.0)
Eosinophils Absolute: 0.2 10*3/uL (ref 0.0–0.7)
Eosinophils Relative: 3.6 % (ref 0.0–5.0)
HCT: 47.7 % (ref 39.0–52.0)
Hemoglobin: 15.5 g/dL (ref 13.0–17.0)
Lymphocytes Relative: 24 % (ref 12.0–46.0)
Lymphs Abs: 1.5 10*3/uL (ref 0.7–4.0)
MCHC: 32.4 g/dL (ref 30.0–36.0)
MCV: 89.5 fL (ref 78.0–100.0)
Monocytes Absolute: 0.5 10*3/uL (ref 0.1–1.0)
Monocytes Relative: 8.5 % (ref 3.0–12.0)
Neutro Abs: 3.8 10*3/uL (ref 1.4–7.7)
Neutrophils Relative %: 62.6 % (ref 43.0–77.0)
Platelets: 231 10*3/uL (ref 150.0–400.0)
RBC: 5.33 Mil/uL (ref 4.22–5.81)
RDW: 13.9 % (ref 11.5–15.5)
WBC: 6.1 10*3/uL (ref 4.0–10.5)

## 2023-11-22 LAB — COMPREHENSIVE METABOLIC PANEL
ALT: 37 U/L (ref 0–53)
AST: 19 U/L (ref 0–37)
Albumin: 4.4 g/dL (ref 3.5–5.2)
Alkaline Phosphatase: 94 U/L (ref 39–117)
BUN: 19 mg/dL (ref 6–23)
CO2: 29 meq/L (ref 19–32)
Calcium: 9.1 mg/dL (ref 8.4–10.5)
Chloride: 105 meq/L (ref 96–112)
Creatinine, Ser: 0.91 mg/dL (ref 0.40–1.50)
GFR: 87.64 mL/min (ref >60.00)
Glucose, Bld: 99 mg/dL (ref 70–99)
Potassium: 4.2 meq/L (ref 3.5–5.1)
Sodium: 144 meq/L (ref 135–145)
Total Bilirubin: 1.4 mg/dL — ABNORMAL HIGH (ref 0.2–1.2)
Total Protein: 6.4 g/dL (ref 6.0–8.3)

## 2023-11-22 LAB — PSA, MEDICARE: PSA: 5.79 ng/mL — ABNORMAL HIGH (ref 0.10–4.00)

## 2023-11-22 NOTE — Patient Instructions (Addendum)
Check status of flu vaccine - can be given here with a nurse visit if needed.  Xray of ribs for side pain (address of imaging facility below). You can try over-the-counter Aleve or ibuprofen if needed temporarily to see if that makes that soreness better.  That could be some inflammation around the ribs but I would like to follow-up in the next month to discuss this further.  Sooner if worse. I will check your cholesterol levels and we can discuss plan once I see those results.  Previous levels were elevated, which was reason for recommended higher dose from cardiology.  Again lets look at the numbers first and go from there.  Discuss skin issue of abdomen with Dr. Emily Filbert, but if that area changes be seen sooner. I suspect it is a benign lesion called a lipoma.   [facility] or xray: Walk in 8:30-4:30 during weekdays, no appointment needed [address]  [place], [place] [address]  Preventive Care 65 Years and Older, Male Preventive care refers to lifestyle choices and visits with your health care provider that can promote health and wellness. Preventive care visits are also called wellness exams. What can I expect for my preventive care visit? Counseling During your preventive care visit, your health care provider may ask about your: Medical history, including: Past medical problems. Family medical history. History of falls. Current health, including: Emotional well-being. Home life and relationship well-being. Sexual activity. Memory and ability to understand (cognition). Lifestyle, including: Alcohol, nicotine or tobacco, and drug use. Access to firearms. Diet, exercise, and sleep habits. Work and work Astronomer. Sunscreen use. Safety issues such as seatbelt and bike helmet use. Physical exam Your health care provider will check your: Height and weight. These may be used to calculate your BMI (body mass index). BMI is a measurement that tells if you are at a healthy  weight. Waist circumference. This measures the distance around your waistline. This measurement also tells if you are at a healthy weight and may help predict your risk of certain diseases, such as type 2 diabetes and high blood pressure. Heart rate and blood pressure. Body temperature. Skin for abnormal spots. What immunizations do I need?  Vaccines are usually given at various ages, according to a schedule. Your health care provider will recommend vaccines for you based on your age, medical history, and lifestyle or other factors, such as travel or where you work. What tests do I need? Screening Your health care provider may recommend screening tests for certain conditions. This may include: Lipid and cholesterol levels. Diabetes screening. This is done by checking your blood sugar (glucose) after you have not eaten for a while (fasting). Hepatitis C test. Hepatitis B test. HIV (human immunodeficiency virus) test. STI (sexually transmitted infection) testing, if you are at risk. Lung cancer screening. Colorectal cancer screening. Prostate cancer screening. Abdominal aortic aneurysm (AAA) screening. You may need this if you are a current or former smoker. Talk with your health care provider about your test results, treatment options, and if necessary, the need for more tests. Follow these instructions at home: Eating and drinking  Eat a diet that includes fresh fruits and vegetables, whole grains, lean protein, and low-fat dairy products. Limit your intake of foods with high amounts of sugar, saturated fats, and salt. Take vitamin and mineral supplements as recommended by your health care provider. Do not drink alcohol if your health care provider tells you not to drink. If you drink alcohol: Limit how much you  have to 0-2 drinks a day. Know how much alcohol is in your drink. In the [place] one drink equals one 12 oz bottle of beer (355 mL), one 5 oz glass of wine (148 mL), or one 1 oz  glass of hard liquor (44 mL). Lifestyle Brush your teeth every morning and night with fluoride toothpaste. Floss one time each day. Exercise for at least 30 minutes 5 or more days each week. Do not use any products that contain nicotine or tobacco. These products include cigarettes, chewing tobacco, and vaping devices, such as e-cigarettes. If you need help quitting, ask your health care provider. Do not use drugs. If you are sexually active, practice safe sex. Use a condom or other form of protection to prevent STIs. Take aspirin only as told by your health care provider. Make sure that you understand how much to take and what form to take. Work with your health care provider to find out whether it is safe and beneficial for you to take aspirin daily. Ask your health care provider if you need to take a cholesterol-lowering medicine (statin). Find healthy ways to manage stress, such as: Meditation, yoga, or listening to music. Journaling. Talking to a trusted person. Spending time with friends and family. Safety Always wear your seat belt while driving or riding in a vehicle. Do not drive: If you have been drinking alcohol. Do not ride with someone who has been drinking. When you are tired or distracted. While texting. If you have been using any mind-altering substances or drugs. Wear a helmet and other protective equipment during sports activities. If you have firearms in your house, make sure you follow all gun safety procedures. Minimize exposure to UV radiation to reduce your risk of skin cancer. What's next? Visit your health care provider once a year for an annual wellness visit. Ask your health care provider how often you should have your eyes and teeth checked. Stay up to date on all vaccines. This information is not intended to replace advice given to you by your health care provider. Make sure you discuss any questions you have with your health care provider. Document Revised:  04/12/2021 Document Reviewed: 04/12/2021 Elsevier Patient Education  2024 [employer].

## 2023-11-22 NOTE — Progress Notes (Unsigned)
Subjective:  Patient ID: Ralph Hayes, male    DOB: 1956-06-26  Age: 68 y.o. MRN: [id number]  CC:  Chief Complaint  Patient presents with   Annual Exam    Pt notes he would like a pneumonia vaccine today with his visit, notes he still has the pain in his side like he has mentioned in the past and wonders if anything was captured on his recent MRI     HPI Ralph Hayes presents for Annual Exam And concern above.   PCP, [facility], Dr. Rosemary Holms, hyperlipidemia with elevated coronary calcium score.  Lipitor 80 mg daily.  Echo February 2023 with EF 45 to 50%, mild global hypokinesis.  Grade 1 mitral regurgitation and mild tricuspid regurgitation.  Repeat echo 12/17/2022 improved with calculated EF 71%.  Normal global wall motion.  Grade 1 diastolic dysfunction. Urology, Dr. Mena Goes, elevated PSA.  MRI prostate July 17, 2023, small PI-RADS category 3 lesion of left posterior lateral peripheral zone at the apex.  BPH and prostatomegaly.Office visit noted from November 18.  PSAD was reassuring and PSA back to normal, plan for 44-month office visit.  BPH without LUTS.  Lump on left side of chest wall, sore at times for years. LUQ,, swollen area. Tender behind area. Possible costochondritis. Always sore. Rib xray 09/02/18 - negative.  No treatments. No back pain.  Derm - Dr. Emily Filbert - appt few months ago - did not discuss area on side. ? More prominent.   Lab Results  Component Value Date   HGBA1C 5.6 11/21/2022    Hyperlipidemia: Lipitor 80 mg daily recommended by cardiology - he has remained on 40mg .  Last labs in July still with elevated LDL at that time.  Continued on same meds with plan on dietary changes, exercise and recheck in 6 months. Normal BP on home testing.  Lab Results  Component Value Date   CHOL 214 (H) 05/06/2023   HDL 49.80 05/06/2023   LDLCALC 109 (H) 11/21/2022   LDLDIRECT 128.0 05/06/2023   TRIG 228.0 (H) 05/06/2023   CHOLHDL 4 05/06/2023   Lab Results   Component Value Date   ALT 36 05/06/2023   AST 20 05/06/2023   ALKPHOS 80 05/06/2023   BILITOT 1.4 (H) 05/06/2023   Elevated bilirubin Pathology review 03/04/2023 without apparent concerns.  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  Asymptomatic.  1.4 in July of last year, with a range of 1.4-1.8.      11/22/2023    8:07 AM 03/04/2023   10:15 AM 11/21/2022    8:43 AM 04/20/2022    8:39 AM 10/18/2021    8:30 AM  Depression screen PHQ 2/9  Decreased Interest 0 0 0 0 0  Down, Depressed, Hopeless 0 0 0 0 0  PHQ - 2 Score 0 0 0 0 0  Altered sleeping 0 0 0  0  Tired, decreased energy 0 0 0  0  Change in appetite 0 0 0  0  Feeling bad or failure about yourself  0 0 0  0  Trouble concentrating 0 0 0  0  Moving slowly or fidgety/restless 0 0 0  0  Suicidal thoughts 0 0 0  0  PHQ-9 Score 0 0 0  0  Difficult doing work/chores  Not difficult at all   Not difficult at all    Health Maintenance  Topic Date Due   Colonoscopy  01/11/2028   DTaP/Tdap/Td (3 - Td or Tdap) 02/28/2028   Pneumonia Vaccine 64+ Years old  Completed  INFLUENZA VACCINE  Completed   Hepatitis C Screening  Completed   Zoster Vaccines- Shingrix  Completed   HPV VACCINES  Aged Out   COVID-19 Vaccine  Discontinued  Colonoscopy in 2019, repeat 5 years, Dr. Marina Goodell.  Repeat 01/11/2023 - 5 year repeat.  Prostate: Followed by urology as above.  Dr. Mena Goes. PSA today.  Lab Results  Component Value Date   PSA1 3.8 03/29/2020   PSA1 3.4 09/01/2018   PSA1 2.9 09/12/2017   PSA 4.05 (H) 11/21/2022   PSA 4.77 (H) 04/17/2021   PSA 2.52 03/08/2016    Immunization History  Administered Date(s) Administered   Influenza Inj Mdck Quad Pf 08/20/2018   Influenza Split 08/05/2012   Influenza,inj,Quad PF,6+ Mos 09/21/2014, 10/08/2016, 09/12/2017   Influenza-Unspecified 09/12/2020, 09/05/2021   Moderna Covid-19 Fall Seasonal Vaccine 6yrs & older 06/29/2023   Moderna Sars-Covid-2 Vaccination 01/04/2020, 02/01/2020   PFIZER(Purple  Top)SARS-COV-2 Vaccination 10/02/2020   PNEUMOCOCCAL CONJUGATE-20 11/21/2022   Tdap 11/25/2007, 02/27/2018   Varicella 11/25/2007   Zoster Recombinant(Shingrix) 03/03/2018, 07/26/2018   Zoster, Live 05/06/2014  Flu vaccine last fall?   No results found. Last optho 2 years ago. Appt with Dr. Jewel Baize soon.   New hearing aids as of yesterday, getting used to them.   Dental: every 6 months. Appt next week.   Alcohol: 5 per week - wine.   Tobacco: none  Exercise: some dancing, cruise next week.    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   Bradycardia 05/04/2014   Past Medical History:  Diagnosis Date   Hyperlipidemia    Medical history non-contributory    Umbilical hernia    Past Surgical History:  Procedure Laterality Date   COLONOSCOPY     EYE SURGERY N/A    [employer]reesia 03/29/2020   HERNIA REPAIR N/A    [employer]reesia 03/29/2020   NO PAST SURGERIES     UMBILICAL HERNIA REPAIR N/A 12/28/2016   Procedure: LAPAROSCOPIC UMBILICAL HERNIA;  Surgeon: Ovidio Kin, MD;  Location: [facility] OR;  Service: General;  Laterality: N/A;   UMBILICAL HERNIA REPAIR N/A 12/18/2018   Procedure: LAPAROSCOPIC EXPLORATION  AND REPAIR OF UMBILICAL HERNIA ERAS PATHWAY;  Surgeon: Ovidio Kin, MD;  Location: [facility] ORS;  Service: General;  Laterality: N/A;   Allergies  Allergen Reactions   Penicillins Swelling    SWELLING REACTION UNSPECIFIED   Has patient had a PCN reaction causing immediate rash, facial/tongue/throat swelling, SOB or lightheadedness with hypotension:unsure Has patient had a PCN reaction  causing severe rash involving mucus membranes or skin necrosis:unsure Has patient had a PCN reaction that required hospitalization:No Has patient had a PCN reaction occurring within the last 10 years:unsure If all of the above answers are "NO", then may proceed with Cephalosporin use. Childhood reaction,   Prior to Admission medications   Medication Sig Start Date End Date Taking? Authorizing Provider  co-enzyme Q-10 50 MG capsule Take 300 mg by mouth daily.   Yes [provider]  MULTIPLE VITAMINS ESSENTIAL PO    Yes [provider]  atorvastatin (LIPITOR) 80 MG tablet Take 1 tablet (80 mg total) by mouth daily. Patient taking differently: Take 40 mg  by mouth daily. 07/09/23   Patwardhan, Anabel Bene, MD   Social History   Socioeconomic History   Marital status: Married    Spouse name: Not on file   Number of children: 2   Years of education: Not on file   Highest education level: Bachelor's degree (e.g., BA, AB, BS)  Occupational History   Not on file  Tobacco Use   Smoking status: Never   Smokeless tobacco: Never  Vaping Use   Vaping status: Never Used  Substance and Sexual Activity   Alcohol use: Yes    Alcohol/week: 2.0 standard drinks of alcohol    Types: 2 Glasses of wine per week    Comment: 2-3 glasses 1-2 times a week   Drug use: No   Sexual activity: Yes  Other Topics Concern   Not on file  Social History Narrative   Married. Education: [employer].    Social Drivers of Health   Financial Resource Strain: Patient Declined (03/04/2023)   Overall Financial Resource Strain (CARDIA)    Difficulty of Paying Living Expenses: Patient declined  Food Insecurity: Patient Declined (03/04/2023)   Hunger Vital Sign    Worried About Running Out of Food in the Last Year: Patient declined    Ran Out of Food in the Last Year: Patient declined  Transportation Needs: No Transportation Needs (03/04/2023)   PRAPARE - Administrator, Civil Service (Medical): No     Lack of Transportation (Non-Medical): No  Physical Activity: Insufficiently Active (03/04/2023)   Exercise Vital Sign    Days of Exercise per Week: 1 day    Minutes of Exercise per Session: 60 min  Stress: Stress Concern Present (03/04/2023)   [employer] of Occupational Health - Occupational Stress Questionnaire    Feeling of Stress : To some extent  Social Connections: Unknown (03/04/2023)   Social Connection and Isolation Panel [NHANES]    Frequency of Communication with Friends and Family: Three times a week    Frequency of Social Gatherings with Friends and Family: Once a week    Attends Religious Services: Never    Database administrator or Organizations: Patient declined    Attends Engineer, structural: Not on file    Marital Status: Patient declined  Intimate Partner Violence: Not on file    Review of Systems 13 point review of systems per patient health survey noted.  Negative other than as indicated above or in HPI.    Objective:   Vitals:   11/22/23 0810  BP: 134/78  Pulse: 60  Temp: 98 F (36.7 C)  TempSrc: Temporal  SpO2: 96%  Weight: 238 lb 12.8 oz (108.3 kg)  Height: 6' (1.829 m)   {Vitals History (Optional):23777}  Physical Exam Vitals reviewed.  Constitutional:      Appearance: He is well-developed.  HENT:     Head: Normocephalic and atraumatic.     Right Ear: External ear normal.     Left Ear: External ear normal.  Eyes:     Conjunctiva/sclera: Conjunctivae normal.     Pupils: Pupils are equal, round, and reactive to light.  Neck:     Thyroid: No thyromegaly.  Cardiovascular:     Rate and Ralph: Normal rate and regular Ralph.     Heart sounds: Normal heart sounds.  Pulmonary:     Effort: Pulmonary effort is normal. No respiratory distress.     Breath sounds: Normal breath sounds. No wheezing.     Comments: Tender to palpation over  left lower ribs, anterior axillary to mid axillary line.  No apparent soft tissue swelling or bony  deformity appreciated.  Area of concern of left upper abdomen with approximately 1.5 to 2 cm soft rounded lesion just under skin, no skin erythema or discharge.  No visible skin lesions on top of skin. Abdominal:     General: There is no distension.     Palpations: Abdomen is soft.     Tenderness: There is no abdominal tenderness.  Musculoskeletal:        General: No tenderness. Normal range of motion.     Cervical back: Normal range of motion and neck supple.  Lymphadenopathy:     Cervical: No cervical adenopathy.  Skin:    General: Skin is warm and dry.  Neurological:     Mental Status: He is alert and oriented to person, place, and time.     Deep Tendon Reflexes: Reflexes are normal and symmetric.  Psychiatric:        Behavior: Behavior normal.        Assessment & Plan:  Ralph Hayes is a 68 y.o. male . Annual physical exam  Screening for prostate cancer  Screening for diabetes mellitus  Hyperlipidemia, unspecified hyperlipidemia type   No orders of the defined types were placed in this encounter.  There are no Patient Instructions on file for this visit.    Signed,   Meredith Staggers, MD [facility], [facility] 11/22/23 8:33 AM

## 2023-11-24 ENCOUNTER — Encounter: Payer: Self-pay | Admitting: Family Medicine

## 2023-11-27 ENCOUNTER — Encounter: Payer: Self-pay | Admitting: Family Medicine

## 2023-11-27 ENCOUNTER — Ambulatory Visit (INDEPENDENT_AMBULATORY_CARE_PROVIDER_SITE_OTHER)
Admission: RE | Admit: 2023-11-27 | Discharge: 2023-11-27 | Disposition: A | Discharge status: Home / Self Care | Payer: Federal, State, Local not specified - PPO | Source: Ambulatory Visit | Attending: Family Medicine | Admitting: Family Medicine

## 2023-11-27 DIAGNOSIS — R0781 Pleurodynia: Secondary | ICD-10-CM | POA: Diagnosis not present

## 2023-12-02 ENCOUNTER — Telehealth: Payer: Self-pay

## 2023-12-02 NOTE — Telephone Encounter (Signed)
-----   Message from Shade Flood sent at 12/02/2023 12:31 PM EST ----- Results sent by MyChart, but appears patient has not yet reviewed those results.  Please call and make sure they have either seen note or discuss result note.  Thanks.

## 2023-12-13 ENCOUNTER — Telehealth: Payer: Self-pay | Admitting: Family Medicine

## 2023-12-13 DIAGNOSIS — R0781 Pleurodynia: Secondary | ICD-10-CM

## 2023-12-13 DIAGNOSIS — R079 Chest pain, unspecified: Secondary | ICD-10-CM

## 2023-12-13 NOTE — Telephone Encounter (Signed)
Copied from CRM [id number]. Topic: General - Call Back - No Documentation >> Dec 09, 2023 10:26 AM Ralph Hayes B wrote: Reason for CRM: Pt stated that he has received two calls since being out of the country, and I informed him that it was regarding test results. He would like a callback to discuss his results.   Additionally pt would like too discuss why his upcoming appointment on 12/27/23 at 11:20 am for an office visit was cancelled.  Pt was sent a message in MyChart letting him know Chilton Si will not be available 12/27/23.

## 2023-12-13 NOTE — Telephone Encounter (Signed)
Noted.  I have ordered the CT scan.  Please let him know and apologize for him having to be rescheduled, that is when I am having to travel for family reasons.

## 2023-12-13 NOTE — Addendum Note (Signed)
Addended by: Meredith Staggers R on: 12/13/2023 02:55 PM   Modules accepted: Orders

## 2023-12-13 NOTE — Telephone Encounter (Signed)
Called patient back and went over the Xray and lab results patient would like to proceed with CT scan and we also got his appointment rescheduled for to 01/20/24 due to having to cancel 12/27/23

## 2023-12-13 NOTE — Telephone Encounter (Signed)
Patient has already been scheduled when I called to inform him

## 2023-12-19 DIAGNOSIS — Z961 Presence of intraocular lens: Secondary | ICD-10-CM | POA: Insufficient documentation

## 2023-12-27 ENCOUNTER — Ambulatory Visit: Payer: Federal, State, Local not specified - PPO | Admitting: Family Medicine

## 2023-12-28 ENCOUNTER — Other Ambulatory Visit: Payer: Self-pay | Admitting: Family Medicine

## 2023-12-28 DIAGNOSIS — E785 Hyperlipidemia, unspecified: Secondary | ICD-10-CM

## 2024-01-08 ENCOUNTER — Ambulatory Visit
Admission: RE | Admit: 2024-01-08 | Discharge: 2024-01-08 | Disposition: A | Discharge status: Home / Self Care | Payer: Federal, State, Local not specified - PPO | Source: Ambulatory Visit | Attending: Family Medicine | Admitting: Family Medicine

## 2024-01-08 DIAGNOSIS — R0789 Other chest pain: Secondary | ICD-10-CM

## 2024-01-08 DIAGNOSIS — R079 Chest pain, unspecified: Secondary | ICD-10-CM

## 2024-01-08 DIAGNOSIS — R0781 Pleurodynia: Secondary | ICD-10-CM

## 2024-01-10 ENCOUNTER — Encounter: Payer: Self-pay | Admitting: Family Medicine

## 2024-01-20 ENCOUNTER — Encounter: Payer: Self-pay | Admitting: Family Medicine

## 2024-01-20 ENCOUNTER — Ambulatory Visit: Payer: Federal, State, Local not specified - PPO | Admitting: Family Medicine

## 2024-01-20 VITALS — BP 128/72 | HR 76 | Temp 97.8°F | Ht 72.0 in | Wt 238.2 lb

## 2024-01-20 DIAGNOSIS — E785 Hyperlipidemia, unspecified: Secondary | ICD-10-CM

## 2024-01-20 DIAGNOSIS — R0781 Pleurodynia: Secondary | ICD-10-CM | POA: Diagnosis not present

## 2024-01-20 MED ORDER — ATORVASTATIN CALCIUM 80 MG PO TABS
80.0000 mg | ORAL_TABLET | Freq: Every day | ORAL | 1 refills | Status: DC
Start: 2024-01-20 — End: 2024-07-23

## 2024-01-20 NOTE — Patient Instructions (Signed)
 Thanks for coming in today.  I sent the higher dose of atorvastatin to your pharmacy, 80 mg, once per day.  Lab only visit at [facility] in the next few weeks.  Depending on those readings can discuss if other changes needed.  X-ray, CT scan were reassuring for the chest wall discomfort.  I would consider meeting with physical medicine rehab specialist and asked if you have continued soreness to decide if trigger point injection or other treatment may be indicated.  Let me know if you would like me to place that referral.  Follow-up in 6 months, sooner if needed.  Take care.   [facility] or xray: Walk in 8:30-4:30 during weekdays, no appointment needed [address]  [place], [place] [address]

## 2024-01-20 NOTE — Progress Notes (Signed)
 Subjective:  Patient ID: Ralph Hayes, male    DOB: [birth year]  Age: 68 y.o. MRN: [id number]  CC:  Chief Complaint  Patient presents with   Chest Wall Pain    Still having pains and soreness notes doing okay     HPI Ralph Hayes presents for   Chest wall discomfort: See prior visits.  Lump on the left side of his chest wall for years, with intermittent soreness.  Had noted a swollen area or tenderness behind that area and possible costochondritis previously.  Has had persistent soreness in that area for some time.  Previous rib x-ray in 2019 was negative.  On exam at his January 24 visit appeared to have 2 issues with left-sided rib pain, possible costochondritis or chest wall pain and recommended short-term NSAID, imaging performed.  Also found to have a small lipoma or soft tissue concern, superficial, recommended discussion with his dermatologist. Rib series on November 27, 2023, no fracture lesion involving the ribs.  Palpable marker was beyond the level of rib ossification at the cartilage level.  Subsequent CT chest without contrast on 01/08/2024, lungs are clear, no pleural effusion, pneumothorax or pulmonary nodules.  No chest wall mass or suspicious bone lesion identified and no rib fractures.  Has not yet seen dermatologist. Soft tissue area on upper abdomen - seems to have decreased in size, not painful.  Still sore on rib area. Chronic pain for years, no recent changes. Only sore to press on it. No cough/dyspnea. Nothing seems to make better or worse, no apparent triggers. No change with movement. Sore to press on area.  No mid back pain, no change with strain or coughing.  No current treatment.   Hyperlipidemia: See prior notes - he did increase to 80mg  total lipitor past month. No new side effects.   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   Bradycardia 05/04/2014   Past Medical History:  Diagnosis Date   Hyperlipidemia    Medical history non-contributory    Umbilical hernia    Past Surgical History:  Procedure Laterality Date   COLONOSCOPY     EYE SURGERY N/A    Phreesia 03/29/2020   HERNIA REPAIR N/A    Phreesia 03/29/2020   NO PAST SURGERIES     UMBILICAL HERNIA REPAIR N/A 12/28/2016   Procedure: LAPAROSCOPIC UMBILICAL HERNIA;  Surgeon: Ovidio Kin, MD;  Location: [facility] OR;  Service: General;  Laterality: N/A;   UMBILICAL HERNIA REPAIR N/A 12/18/2018   Procedure: LAPAROSCOPIC EXPLORATION  AND REPAIR OF UMBILICAL HERNIA ERAS PATHWAY;  Surgeon: Ovidio Kin, MD;  Location: [facility];  Service: General;  Laterality: N/A;   Allergies  Allergen Reactions   Penicillins Swelling    SWELLING  REACTION UNSPECIFIED   Has patient had a PCN reaction causing immediate rash, facial/tongue/throat swelling, SOB or lightheadedness with hypotension:unsure Has patient had a PCN reaction causing severe rash involving mucus membranes or skin necrosis:unsure Has patient had a PCN reaction that required hospitalization:No Has patient had a PCN reaction occurring within the last 10 years:unsure If all of the above answers are "NO", then may proceed with Cephalosporin use. Childhood reaction,   Prior to Admission medications    Medication Sig Start Date End Date Taking? Authorizing Provider  co-enzyme Q-10 50 MG capsule Take 300 mg by mouth daily.   Yes [provider]   Social History   Socioeconomic History   Marital status: Married    Spouse name: Not on file   Number of children: 2   Years of education: Not on file   Highest education level: Bachelor's degree (e.g., BA, AB, BS)  Occupational History   Not on file  Tobacco Use   Smoking status: Never   Smokeless tobacco: Never  Vaping Use   Vaping status: Never Used  Substance and Sexual Activity   Alcohol use: Yes    Alcohol/week: 2.0 standard drinks of alcohol    Types: 2 Glasses of wine per week    Comment: 2-3 glasses 1-2 times a week   Drug use: No   Sexual activity: Yes  Other Topics Concern   Not on file  Social History Narrative   Married. Education: [employer].    Social Drivers of Corporate investment banker Strain: Low Risk  (01/17/2024)   Overall Financial Resource Strain (CARDIA)    Difficulty of Paying Living Expenses: Not hard at all  Food Insecurity: No Food Insecurity (01/17/2024)   Hunger Vital Sign    Worried About Running Out of Food in the Last Year: Never true    Ran Out of Food in the Last Year: Never true  Transportation Needs: No Transportation Needs (01/17/2024)   PRAPARE - Administrator, Civil Service (Medical): No    Lack of Transportation (Non-Medical): No  Physical Activity: Insufficiently Active (01/17/2024)   Exercise Vital Sign    Days of Exercise per Week: 1 day    Minutes of Exercise per Session: 40 min  Stress: No Stress Concern Present (01/17/2024)   [employer] of Occupational Health - Occupational Stress Questionnaire    Feeling of Stress : Only a little  Social Connections: Unknown (01/17/2024)   Social Connection and Isolation Panel [NHANES]    Frequency of Communication with Friends and Family: More than three times a week    Frequency of Social Gatherings with Friends and  Family: Once a week    Attends Religious Services: Patient declined    Database administrator or Organizations: No    Attends Engineer, structural: Not on file    Marital Status: Married  Catering manager Violence: Not on file    Review of Systems Per HPI.   Objective:   Vitals:   01/20/24 1020  BP: 128/72  Pulse: 76  Temp: 97.8 F (36.6 C)  TempSrc: Temporal  SpO2: 98%  Weight: 238 lb 3.2 oz (108 kg)  Height: 6' (1.829 m)     Physical Exam Vitals reviewed.  Constitutional:      Appearance: He is well-developed.  HENT:     Head: Normocephalic and atraumatic.  Neck:     Vascular: No carotid bruit or JVD.  Cardiovascular:     Rate and Rhythm: Normal rate  and regular rhythm.     Heart sounds: Normal heart sounds. No murmur heard. Pulmonary:     Effort: Pulmonary effort is normal.     Breath sounds: Normal breath sounds. No rales.  Chest:    Musculoskeletal:     Right lower leg: No edema.     Left lower leg: No edema.  Skin:    General: Skin is warm and dry.  Neurological:     Mental Status: He is alert and oriented to person, place, and time.  Psychiatric:        Mood and Affect: Mood normal.     Assessment & Plan:  CHRISTIANJAMES SOULE is a 68 y.o. male . Hyperlipidemia, unspecified hyperlipidemia type - Plan: Comprehensive metabolic panel, Lipid panel, atorvastatin (LIPITOR) 80 MG tablet  -Tolerating higher dose Lipitor, lab only visit next few weeks and adjust plan accordingly, new dose of 80 mg ordered.  Rib pain on left side  -Chronic discomfort, only painful with palpation of area.  Question intercostal nerve irritation.  Imaging reassuring.  No pain with usual activity, only with local palpation.  Option to meet with physical medicine and rehab, question trigger point injection or block, but that could be discussed with PMR.  Referral declined at this time, advised to let me know if he would like referral.  RTC precautions if new or worsening  symptoms.  Meds ordered this encounter  Medications   atorvastatin (LIPITOR) 80 MG tablet    Sig: Take 1 tablet (80 mg total) by mouth daily.    Dispense:  90 tablet    Refill:  1   Patient Instructions  Thanks for coming in today.  I sent the higher dose of atorvastatin to your pharmacy, 80 mg, once per day.  Lab only visit at [facility] in the next few weeks.  Depending on those readings can discuss if other changes needed.  X-ray, CT scan were reassuring for the chest wall discomfort.  I would consider meeting with physical medicine rehab specialist and asked if you have continued soreness to decide if trigger point injection or other treatment may be indicated.  Let me know if you would like me to place that referral.  Follow-up in 6 months, sooner if needed.  Take care.   [facility] or xray: Walk in 8:30-4:30 during weekdays, no appointment needed [address]  [place], [place] [address]     Signed,   Meredith Staggers, MD [facility], [facility] 01/20/24 11:13 AM

## 2024-07-22 ENCOUNTER — Encounter: Payer: Self-pay | Admitting: Family Medicine

## 2024-07-22 DIAGNOSIS — D225 Melanocytic nevi of trunk: Secondary | ICD-10-CM | POA: Insufficient documentation

## 2024-07-22 DIAGNOSIS — L821 Other seborrheic keratosis: Secondary | ICD-10-CM | POA: Insufficient documentation

## 2024-07-22 DIAGNOSIS — D2330 Other benign neoplasm of skin of unspecified part of face: Secondary | ICD-10-CM | POA: Insufficient documentation

## 2024-07-23 ENCOUNTER — Ambulatory Visit: Admitting: Family Medicine

## 2024-07-23 VITALS — BP 110/64 | HR 56 | Temp 97.9°F | Resp 16 | Ht 72.0 in | Wt 236.2 lb

## 2024-07-23 DIAGNOSIS — N400 Enlarged prostate without lower urinary tract symptoms: Secondary | ICD-10-CM | POA: Diagnosis not present

## 2024-07-23 DIAGNOSIS — Z23 Encounter for immunization: Secondary | ICD-10-CM

## 2024-07-23 DIAGNOSIS — E785 Hyperlipidemia, unspecified: Secondary | ICD-10-CM | POA: Diagnosis not present

## 2024-07-23 LAB — LIPID PANEL
Cholesterol: 167 mg/dL (ref 0–200)
HDL: 44.9 mg/dL (ref >39.00)
LDL Cholesterol: 100 mg/dL — ABNORMAL HIGH (ref 0–99)
NonHDL: 122.15
Total CHOL/HDL Ratio: 4
Triglycerides: 111 mg/dL (ref 0.0–149.0)
VLDL: 22.2 mg/dL (ref 0.0–40.0)

## 2024-07-23 LAB — COMPREHENSIVE METABOLIC PANEL WITH GFR
ALT: 40 U/L (ref 0–53)
AST: 21 U/L (ref 0–37)
Albumin: 4.3 g/dL (ref 3.5–5.2)
Alkaline Phosphatase: 83 U/L (ref 39–117)
BUN: 22 mg/dL (ref 6–23)
CO2: 31 meq/L (ref 19–32)
Calcium: 9.5 mg/dL (ref 8.4–10.5)
Chloride: 105 meq/L (ref 96–112)
Creatinine, Ser: 0.97 mg/dL (ref 0.40–1.50)
GFR: 80.8 mL/min (ref >60.00)
Glucose, Bld: 105 mg/dL — ABNORMAL HIGH (ref 70–99)
Potassium: 4 meq/L (ref 3.5–5.1)
Sodium: 144 meq/L (ref 135–145)
Total Bilirubin: 1.5 mg/dL — ABNORMAL HIGH (ref 0.2–1.2)
Total Protein: 6.6 g/dL (ref 6.0–8.3)

## 2024-07-23 MED ORDER — ATORVASTATIN CALCIUM 80 MG PO TABS
80.0000 mg | ORAL_TABLET | Freq: Every day | ORAL | 1 refills | Status: AC
Start: 2024-07-23 — End: ?

## 2024-07-23 NOTE — Patient Instructions (Signed)
 Thank you for coming in today. No change in medications at this time. If there are any concerns on your bloodwork, I will let you know. Take care!

## 2024-07-23 NOTE — Progress Notes (Signed)
 Subjective:  Patient ID: Ralph Hayes, male    DOB: 14-Mar-1956  Age: 68 y.o. MRN: [id number]  CC:  Chief Complaint  Patient presents with   Hyperlipidemia    Patient is wanting PSA checked    HPI Ralph Hayes presents for  follow up chronic conditions.   Retiring in few months.   PCP, me Cardiology, Dr. Elmira.  Prior elevated coronary calcium  score, on statin as below.  Echo in February 2023 with mild global hypokinesis, EF 45 to 50%.  Improved on repeat echo in February 2024 with EF 71%.  Normal global wall motion.  Grade 1 diastolic dysfunction. Urology, Dr. Nieves with history of elevated PSA.  MRI of prostate in September 2024, small PI-RADS category 3 lesion of left posterior lateral peripheral zone at the apex.  Diagnosed with BPH and prostatomegaly.  92-month office visit planned from November of last year, when PSAD was reassuring and PSA was back to normal. Dermatology, Dr. Robinson - appt in few weeks.   Hyperlipidemia: Treated with Lipitor 80 mg daily with CoQ 10 supplementation.  Dosage was increased earlier this year.  Labs planned today.  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  Asymptomatic. Fasting today.   Would like to lose weight. Will have more time to exercise soon.  Bought over the counter supplement - Aquasculpt, ice water to change metabolism. Has not yet started. Discussed option to meet with weight specialist, and unable to recommend for and against that med.   Lab Results  Component Value Date   CHOL 202 (H) 11/22/2023   HDL 52.90 11/22/2023   LDLCALC 111 (H) 11/22/2023   LDLDIRECT 128.0 05/06/2023   TRIG 191.0 (H) 11/22/2023   CHOLHDL 4 11/22/2023   Lab Results  Component Value Date   ALT 37 11/22/2023   AST 19 11/22/2023   ALKPHOS 94 11/22/2023   BILITOT 1.4 (H) 11/22/2023       07/23/2024    8:03 AM 11/22/2023    8:07 AM 03/04/2023   10:15 AM 11/21/2022    8:43 AM 04/20/2022    8:39 AM  Depression screen PHQ 2/9   Decreased Interest 0 0 0 0 0  Down, Depressed, Hopeless 0 0 0 0 0  PHQ - 2 Score 0 0 0 0 0  Altered sleeping 0 0 0 0   Tired, decreased energy 0 0 0 0   Change in appetite 0 0 0 0   Feeling bad or failure about yourself  0 0 0 0   Trouble concentrating 0 0 0 0   Moving slowly or fidgety/restless 0 0 0 0   Suicidal thoughts 0 0 0 0   PHQ-9 Score 0 0 0 0   Difficult doing work/chores Not difficult at all  Not difficult at all      Health Maintenance  Topic Date Due   Influenza Vaccine  05/29/2024   Colonoscopy  01/11/2028   DTaP/Tdap/Td (3 - Td or Tdap) 02/28/2028   Pneumococcal Vaccine: 50+ Years  Completed   Hepatitis C Screening  Completed   Zoster Vaccines- Shingrix  Completed   HPV VACCINES  Aged Out   Meningococcal B Vaccine  Aged Out   COVID-19 Vaccine  Discontinued  Colonoscopy March 2024, 5-year repeat  Prostate: Previously treated by urology as above, initially repeat PSA requested today.   Chart reviewed, appointment with urology on August 14.  Recent PSA up to 5.2 but PSAD remains low at 0.057.  3 months recheck versus  prostate biopsy recommended.  He opted for repeat PSA.  If elevated at that time, plan for prostate biopsy. Plan for repeat PSA in few months. Did have intercourse last night - decided to defer testing today.  Lab Results  Component Value Date   PSA1 3.8 03/29/2020   PSA1 3.4 09/01/2018   PSA1 2.9 09/12/2017   PSA 5.79 (H) 11/22/2023   PSA 4.05 (H) 11/21/2022   PSA 4.77 (H) 04/17/2021     Immunization History  Administered Date(s) Administered   Fluzone Influenza virus vaccine,trivalent (IIV3), split virus 07/15/2019   INFLUENZA, HIGH DOSE SEASONAL PF 06/29/2023   Influenza Inj Mdck Quad Pf 08/20/2018, 07/15/2019   Influenza Split 08/05/2012   Influenza,inj,Quad PF,6+ Mos 09/21/2014, 10/08/2016, 09/12/2017   Influenza-Unspecified 09/12/2020, 09/05/2021   Moderna Covid-19 Fall Seasonal Vaccine 75yrs & older 03/30/2024   Moderna Sars-Covid-2  Vaccination 01/04/2020, 02/01/2020   PFIZER(Purple Top)SARS-COV-2 Vaccination 10/02/2020   PNEUMOCOCCAL CONJUGATE-20 11/21/2022   Respiratory Syncytial Virus Vaccine,Recomb Aduvanted(Arexvy) 01/12/2023   Tdap 11/25/2007, 02/27/2018   Varicella 11/25/2007   Zoster Recombinant(Shingrix) 03/03/2018, 07/26/2018   Zoster, Live 05/06/2014  Flu vaccine given today.   No results found.  Ophthalmology, Dr. Tressa - saw few months ago. Monitoring for macular degeneration.   New hearing aids in past year.working well.   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   Palpitations 11/22/2021   Nuclear sclerosis of both eyes 12/02/2019   Cervical disc disorder with radiculopathy of cervical region 11/17/2014   Acromioclavicular joint arthritis 11/17/2014   Drusen of left macula 09/30/2014   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   S/P LASIK (laser assisted in situ keratomileusis) of both eyes 08/16/2014   Bradycardia 05/04/2014   Past Medical History:  Diagnosis Date   Allergy 68 years old   Pennicillin   Hyperlipidemia    Medical history non-contributory    Umbilical hernia    Past Surgical History:  Procedure Laterality Date   COLONOSCOPY     EYE SURGERY N/A    [facility]reesia 03/29/2020   HERNIA REPAIR N/A    Phreesia 03/29/2020   NO PAST SURGERIES     UMBILICAL HERNIA REPAIR N/A 12/28/2016   Procedure: LAPAROSCOPIC UMBILICAL HERNIA;  Surgeon: Alm Angle, MD;  Location: [facility] OR;  Service: General;  Laterality: N/A;   UMBILICAL HERNIA  REPAIR N/A 12/18/2018   Procedure: LAPAROSCOPIC EXPLORATION  AND REPAIR OF UMBILICAL HERNIA ERAS PATHWAY;  Surgeon: Angle Alm, MD;  Location: [facility];  Service: General;  Laterality: N/A;   Allergies  Allergen Reactions   Penicillins Swelling    SWELLING REACTION UNSPECIFIED   Has patient had a PCN reaction causing immediate rash, facial/tongue/throat swelling, SOB or lightheadedness with hypotension:unsure Has patient had a PCN reaction causing severe rash involving mucus membranes or skin necrosis:unsure Has patient had a PCN reaction that required hospitalization:No Has patient had a PCN reaction occurring within the last 10 years:unsure If all of the above answers are NO, then may proceed with Cephalosporin use. Childhood reaction,   Prior to Admission medications   Medication Sig Start Date End  Date Taking? Authorizing Provider  atorvastatin  (LIPITOR) 80 MG tablet Take 1 tablet (80 mg total) by mouth daily. 01/20/24  Yes Levora Reyes SAUNDERS, MD  co-enzyme Q-10 50 MG capsule Take 300 mg by mouth daily.   Yes [provider]   Social History   Socioeconomic History   Marital status: Married    Spouse name: Not on file   Number of children: 2   Years of education: Not on file   Highest education level: Bachelor's degree (e.g., BA, AB, BS)  Occupational History   Not on file  Tobacco Use   Smoking status: Never   Smokeless tobacco: Never  Vaping Use   Vaping status: Never Used  Substance and Sexual Activity   Alcohol use: Yes    Alcohol/week: 2.0 standard drinks of alcohol    Types: 2 Glasses of wine per week    Comment: 2-3 glasses 1-2 times a week   Drug use: No   Sexual activity: Yes  Other Topics Concern   Not on file  Social History Narrative   Married. Education: [employer].    Social Drivers of Health   Financial Resource Strain: Patient Declined (07/20/2024)   Overall Financial Resource Strain (CARDIA)    Difficulty of Paying Living Expenses: Patient  declined  Food Insecurity: Patient Declined (07/20/2024)   Hunger Vital Sign    Worried About Running Out of Food in the Last Year: Patient declined    Ran Out of Food in the Last Year: Patient declined  Transportation Needs: No Transportation Needs (07/20/2024)   PRAPARE - Administrator, Civil Service (Medical): No    Lack of Transportation (Non-Medical): No  Physical Activity: Insufficiently Active (07/20/2024)   Exercise Vital Sign    Days of Exercise per Week: 1 day    Minutes of Exercise per Session: 40 min  Stress: No Stress Concern Present (07/20/2024)   [employer] of Occupational Health - Occupational Stress Questionnaire    Feeling of Stress: Only a little  Social Connections: Unknown (07/20/2024)   Social Connection and Isolation Panel    Frequency of Communication with Friends and Family: Twice a week    Frequency of Social Gatherings with Friends and Family: Once a week    Attends Religious Services: 1 to 4 times per year    Active Member of [employer] or Organizations: Patient declined    Attends Engineer, structural: Not on file    Marital Status: Married  Catering manager Violence: Not on file    Review of Systems  Constitutional:  Negative for fatigue and unexpected weight change.  Eyes:  Negative for visual disturbance.  Respiratory:  Negative for cough, chest tightness and shortness of breath.   Cardiovascular:  Negative for chest pain, palpitations and leg swelling.  Gastrointestinal:  Negative for abdominal pain and blood in stool.  Neurological:  Negative for dizziness, light-headedness and headaches.     Objective:   Vitals:   07/23/24 0759  BP: 110/64  Pulse: (!) 56  Resp: 16  Temp: 97.9 F (36.6 C)  TempSrc: Temporal  SpO2: 97%  Weight: 236 lb 3.2 oz (107.1 kg)  Height: 6' (1.829 m)     Physical Exam Vitals reviewed.  Constitutional:      Appearance: He is well-developed.  HENT:     Head: Normocephalic and atraumatic.   Neck:     Vascular: No carotid bruit or JVD.  Cardiovascular:     Rate and Rhythm: Normal rate and regular  rhythm.     Heart sounds: Normal heart sounds. No murmur heard. Pulmonary:     Effort: Pulmonary effort is normal.     Breath sounds: Normal breath sounds. No rales.  Musculoskeletal:     Right lower leg: No edema.     Left lower leg: No edema.  Skin:    General: Skin is warm and dry.  Neurological:     Mental Status: He is alert and oriented to person, place, and time.  Psychiatric:        Mood and Affect: Mood normal.        Assessment & Plan:  Ralph Hayes is a 68 y.o. male . Hyperlipidemia, unspecified hyperlipidemia type - Plan: Comprehensive metabolic panel with GFR, Lipid panel, atorvastatin  (LIPITOR) 80 MG tablet  - Tolerating statin, check labs and adjust plan accordingly.  Need for influenza vaccination - Plan: Flu vaccine HIGH DOSE PF(Fluzone Trivalent)  Benign prostatic hyperplasia, unspecified whether lower urinary tract symptoms present  - PSA monitored by urology.  Initially had requested repeat testing but decided against that at this time and plans for follow-up as recommended by urology.  RTC precautions given.  Trying to lose weight as above, plans for increased exercise soon.  Unable to recommend for or against his over-the-counter supplement.  Option to meet with weight management specialist discussed.  Meds ordered this encounter  Medications   atorvastatin  (LIPITOR) 80 MG tablet    Sig: Take 1 tablet (80 mg total) by mouth daily.    Dispense:  90 tablet    Refill:  1    Patient Instructions  Thank you for coming in today. No change in medications at this time. If there are any concerns on your bloodwork, I will let you know. Take care!     Signed,   Reyes Pines, MD [facility], [facility] 07/23/24 8:08 AM

## 2024-07-26 ENCOUNTER — Ambulatory Visit: Payer: Self-pay | Admitting: Family Medicine

## 2024-12-04 DIAGNOSIS — C44519 Basal cell carcinoma of skin of other part of trunk: Secondary | ICD-10-CM | POA: Insufficient documentation

## 2024-12-09 ENCOUNTER — Encounter: Admitting: Family Medicine

## 2024-12-09 DIAGNOSIS — E785 Hyperlipidemia, unspecified: Secondary | ICD-10-CM

## 2024-12-09 DIAGNOSIS — R17 Unspecified jaundice: Secondary | ICD-10-CM

## 2024-12-09 DIAGNOSIS — Z0000 Encounter for general adult medical examination without abnormal findings: Secondary | ICD-10-CM
